# Patient Record
Sex: Female | Born: 1935 | Race: White | Hispanic: No | Smoking: Never smoker
Health system: Southern US, Community
[De-identification: ages and names within clinical notes are randomized; demographics above are authoritative.]

## PROBLEM LIST (undated history)

## (undated) DIAGNOSIS — E860 Dehydration: Secondary | ICD-10-CM

## (undated) DIAGNOSIS — I4891 Unspecified atrial fibrillation: Secondary | ICD-10-CM

## (undated) DIAGNOSIS — I639 Cerebral infarction, unspecified: Secondary | ICD-10-CM

## (undated) DIAGNOSIS — L0390 Cellulitis, unspecified: Secondary | ICD-10-CM

## (undated) HISTORY — DX: Cerebral infarction, unspecified: I63.9

## (undated) HISTORY — DX: Unspecified atrial fibrillation: I48.91

---
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
## 2011-07-26 ENCOUNTER — Encounter: Payer: Self-pay | Admitting: *Deleted

## 2011-07-26 ENCOUNTER — Emergency Department (HOSPITAL_BASED_OUTPATIENT_CLINIC_OR_DEPARTMENT_OTHER)
Admission: EM | Admit: 2011-07-26 | Discharge: 2011-07-26 | Disposition: A | Discharge status: Home / Self Care | Payer: Medicare Other | Attending: Emergency Medicine | Admitting: Emergency Medicine

## 2011-07-26 DIAGNOSIS — M79609 Pain in unspecified limb: Secondary | ICD-10-CM | POA: Insufficient documentation

## 2011-07-26 DIAGNOSIS — M7989 Other specified soft tissue disorders: Secondary | ICD-10-CM | POA: Insufficient documentation

## 2011-07-26 DIAGNOSIS — M79662 Pain in left lower leg: Secondary | ICD-10-CM

## 2011-07-26 LAB — COMPREHENSIVE METABOLIC PANEL
ALT: 8 U/L (ref 0–35)
AST: 18 U/L (ref 0–37)
CO2: 26 mEq/L (ref 19–32)
Calcium: 9.1 mg/dL (ref 8.4–10.5)
Chloride: 101 mEq/L (ref 96–112)
GFR calc Af Amer: >90 mL/min (ref >90)
GFR calc non Af Amer: 83 mL/min — ABNORMAL LOW (ref >90)
Glucose, Bld: 103 mg/dL — ABNORMAL HIGH (ref 70–99)
Sodium: 138 mEq/L (ref 135–145)
Total Bilirubin: 0.3 mg/dL (ref 0.3–1.2)

## 2011-07-26 LAB — DIFFERENTIAL
Basophils Absolute: 0 10*3/uL (ref 0.0–0.1)
Eosinophils Relative: 0 % (ref 0–5)
Lymphocytes Relative: 25 % (ref 12–46)
Lymphs Abs: 1.7 10*3/uL (ref 0.7–4.0)
Monocytes Absolute: 0.7 10*3/uL (ref 0.1–1.0)
Neutro Abs: 4.5 10*3/uL (ref 1.7–7.7)

## 2011-07-26 LAB — URINE MICROSCOPIC-ADD ON

## 2011-07-26 LAB — URINALYSIS, ROUTINE W REFLEX MICROSCOPIC
Bilirubin Urine: NEGATIVE
Glucose, UA: NEGATIVE mg/dL
Hgb urine dipstick: NEGATIVE
Ketones, ur: NEGATIVE mg/dL
Protein, ur: NEGATIVE mg/dL
Urobilinogen, UA: 0.2 mg/dL (ref 0.0–1.0)

## 2011-07-26 LAB — CBC
HCT: 35.7 % — ABNORMAL LOW (ref 36.0–46.0)
MCV: 94.2 fL (ref 78.0–100.0)
RBC: 3.79 MIL/uL — ABNORMAL LOW (ref 3.87–5.11)
RDW: 12 % (ref 11.5–15.5)
WBC: 6.9 10*3/uL (ref 4.0–10.5)

## 2011-07-26 MED ORDER — ENOXAPARIN SODIUM 100 MG/ML ~~LOC~~ SOLN
1.0000 mg/kg | Freq: Once | SUBCUTANEOUS | Status: AC
  Administered 2011-07-26: 50 mg via SUBCUTANEOUS
  Filled 2011-07-26: qty 1

## 2011-07-26 MED ORDER — CEFAZOLIN SODIUM 1-5 GM-% IV SOLN
1.0000 g | Freq: Once | INTRAVENOUS | Status: AC
  Administered 2011-07-26: 1 g via INTRAVENOUS
  Filled 2011-07-26: qty 50

## 2011-07-26 NOTE — ED Notes (Signed)
Pt states she has had swelling to her left lower leg since Thanksgiving. Some redness. Warm to touch.

## 2011-07-26 NOTE — ED Provider Notes (Signed)
History  Scribed for Dione Booze, MD, the patient was seen in room MH03/MH03. This chart was scribed by Candelaria Stagers. The patient's care started at 15:28.      CSN: [id number] Arrival date & time: 07/26/2011  2:14 PM   First MD Initiated Contact with Patient 07/26/11 1506      Chief Complaint  Patient presents with  . Leg Pain     The history is provided by the patient.   Sheila Byrd is a 77 y.o. female who presents to the Emergency Department complaining of swelling, induration, and redness of the left lower extremity that started about four days ago.  The patient states that the area feels sore when weight bearing or ambulating.  Patient has tried elevating the left leg to relieve swelling with transient relief.  She is experiencing no chest pain, SOB, fever, chills, or sweats.  She has no PCP and does not drink or smoke.  Symptoms are described as moderate. He pain and swelling worse when she is standing and walking, better when she is laying flat with her leg elevated. She denies history of trauma.   History reviewed. No pertinent past medical history.  Past Surgical History  Procedure Date  . Tonsillectomy     History reviewed. No pertinent family history.  History  Substance Use Topics  . Smoking status: Never Smoker   . Smokeless tobacco: Not on file  . Alcohol Use: No     Review of Systems  Constitutional: Negative for fever, chills and diaphoresis.  HENT: Negative for rhinorrhea.   Respiratory: Negative for cough and shortness of breath.   Cardiovascular: Negative for chest pain.  10 Systems reviewed and are negative for acute change except as noted in the HPI.   Allergies  Review of patient's allergies indicates no known allergies.  Home Medications  No current outpatient prescriptions on file.  BP 159/71  Pulse 84  Temp(Src) 98.6 F (37 C) (Oral)  Resp 19  Ht 5\' 2"  (1.575 m)  Wt 110 lb (49.896 kg)  BMI 20.12 kg/m2  SpO2 99%  Physical Exam    Nursing note and vitals reviewed. Constitutional: She is oriented to person, place, and time. She appears well-developed and well-nourished. No distress.  HENT:  Head: Normocephalic and atraumatic.  Mouth/Throat: Oropharynx is clear and moist. No oropharyngeal exudate.  Eyes: EOM are normal. Scleral icterus (mild sclera icterus) is present.  Neck: Normal range of motion. Neck supple.  Cardiovascular: Normal rate, regular rhythm and intact distal pulses.   Pulmonary/Chest: Effort normal and breath sounds normal.  Musculoskeletal: She exhibits edema (left lower extremity, warm to touch).       5cmx5cm at area of induration. surrounding area of erythremia present that encompasses area of induration.   Heberden's and bouchard's nodes noted on interphalangeal joints bilaterally.   Lymphadenopathy:    She has no cervical adenopathy.  Neurological: She is alert and oriented to person, place, and time.  Skin: Skin is warm and dry.  Psychiatric: She has a normal mood and affect. Her behavior is normal.  Neck: No JVD. Abdomen: Soft, flat, nontender without masses or hepatosplenomegaly. Musculoskeletal: There are no nodes palpable negative Homans sign. No lymphangitic streaks.  ED Course  Procedures   DIAGNOSTIC STUDIES: Oxygen Saturation is 99% on room air, normal by my interpretation.    COORDINATION OF CARE:  15:33 Ordered: CBC ; Differential ; Comprehensive metabolic panel ; Sedimentation rate ; Urinalysis with microscopic ; US Venous Img Lower Unilateral Left  15:38 Ordered: D-DIMER, QUANTITATIVE   Labs Reviewed - No data to display No results found. Results for orders placed during the hospital encounter of 07/26/11  CBC      Component Value Range   WBC 6.9  4.0 - 10.5 (K/uL)   RBC 3.79 (*) 3.87 - 5.11 (MIL/uL)   Hemoglobin 11.9 (*) 12.0 - 15.0 (g/dL)   HCT 40.9 (*) 81.1 - 46.0 (%)   MCV 94.2  78.0 - 100.0 (fL)   MCH 31.4  26.0 - 34.0 (pg)   MCHC 33.3  30.0 - 36.0 (g/dL)    RDW 91.4  78.2 - 95.6 (%)   Platelets 262  150 - 400 (K/uL)  DIFFERENTIAL      Component Value Range   Neutrophils Relative 64  43 - 77 (%)   Neutro Abs 4.5  1.7 - 7.7 (K/uL)   Lymphocytes Relative 25  12 - 46 (%)   Lymphs Abs 1.7  0.7 - 4.0 (K/uL)   Monocytes Relative 10  3 - 12 (%)   Monocytes Absolute 0.7  0.1 - 1.0 (K/uL)   Eosinophils Relative 0  0 - 5 (%)   Eosinophils Absolute 0.0  0.0 - 0.7 (K/uL)   Basophils Relative 0  0 - 1 (%)   Basophils Absolute 0.0  0.0 - 0.1 (K/uL)  COMPREHENSIVE METABOLIC PANEL      Component Value Range   Sodium 138  135 - 145 (mEq/L)   Potassium 3.8  3.5 - 5.1 (mEq/L)   Chloride 101  96 - 112 (mEq/L)   CO2 26  19 - 32 (mEq/L)   Glucose, Bld 103 (*) 70 - 99 (mg/dL)   BUN 12  6 - 23 (mg/dL)   Creatinine, Ser 2.13  0.50 - 1.10 (mg/dL)   Calcium 9.1  8.4 - 08.6 (mg/dL)   Total Protein 7.6  6.0 - 8.3 (g/dL)   Albumin 3.8  3.5 - 5.2 (g/dL)   AST 18  0 - 37 (U/L)   ALT 8  0 - 35 (U/L)   Alkaline Phosphatase 69  39 - 117 (U/L)   Total Bilirubin 0.3  0.3 - 1.2 (mg/dL)   GFR calc non Af Amer 83 (*) >90 (mL/min)   GFR calc Af Amer >90  >90 (mL/min)  D-DIMER, QUANTITATIVE      Component Value Range   D-Dimer, Quant 1.24 (*) 0.00 - 0.48 (ug/mL-FEU)   No results found.    No diagnosis found.  Vascular ultrasound is not available today. Elevated d-dimer and normal WBC suggest that it is more likely that she has DVT than cellulitis. She will be given a dose of Ancef and a dose of Lovenox here today, and will be brought back tomorrow for a venous Doppler.  MDM  Lower leg swelling and pain in induration-DVT versus cellulitis with cellulitis being much more likely    I personally performed the services described in this documentation, which was scribed in my presence. The recorded information has been reviewed and considered.  Dione Booze, MD 07/26/11 [phone]

## 2011-07-27 ENCOUNTER — Ambulatory Visit (HOSPITAL_BASED_OUTPATIENT_CLINIC_OR_DEPARTMENT_OTHER)
Admission: RE | Admit: 2011-07-27 | Discharge: 2011-07-27 | Disposition: A | Discharge status: Home / Self Care | Payer: Medicare Other | Source: Ambulatory Visit | Attending: Emergency Medicine | Admitting: Emergency Medicine

## 2011-07-27 DIAGNOSIS — M7989 Other specified soft tissue disorders: Secondary | ICD-10-CM | POA: Insufficient documentation

## 2012-03-16 ENCOUNTER — Emergency Department (HOSPITAL_BASED_OUTPATIENT_CLINIC_OR_DEPARTMENT_OTHER)
Admission: EM | Admit: 2012-03-16 | Discharge: 2012-03-16 | Disposition: A | Discharge status: Home Health | Payer: Medicare Other | Attending: Emergency Medicine | Admitting: Emergency Medicine

## 2012-03-16 ENCOUNTER — Encounter (HOSPITAL_BASED_OUTPATIENT_CLINIC_OR_DEPARTMENT_OTHER): Payer: Self-pay | Admitting: *Deleted

## 2012-03-16 DIAGNOSIS — L2089 Other atopic dermatitis: Secondary | ICD-10-CM | POA: Insufficient documentation

## 2012-03-16 DIAGNOSIS — L03116 Cellulitis of left lower limb: Secondary | ICD-10-CM

## 2012-03-16 DIAGNOSIS — L209 Atopic dermatitis, unspecified: Secondary | ICD-10-CM

## 2012-03-16 DIAGNOSIS — L03119 Cellulitis of unspecified part of limb: Secondary | ICD-10-CM | POA: Insufficient documentation

## 2012-03-16 DIAGNOSIS — L02419 Cutaneous abscess of limb, unspecified: Secondary | ICD-10-CM | POA: Insufficient documentation

## 2012-03-16 HISTORY — DX: Cellulitis, unspecified: L03.90

## 2012-03-16 MED ORDER — TRIAMCINOLONE ACETONIDE 0.1 % EX LOTN: TOPICAL_LOTION | Freq: Two times a day (BID) | CUTANEOUS | Status: DC

## 2012-03-16 MED ORDER — SULFAMETHOXAZOLE-TRIMETHOPRIM 800-160 MG PO TABS: 1.0000 | ORAL_TABLET | Freq: Two times a day (BID) | ORAL | Status: DC

## 2012-03-16 MED ORDER — CEPHALEXIN 500 MG PO CAPS: 500.0000 mg | ORAL_CAPSULE | Freq: Four times a day (QID) | ORAL | Status: AC

## 2012-03-16 MED ORDER — CEPHALEXIN 500 MG PO CAPS: 500.0000 mg | ORAL_CAPSULE | Freq: Four times a day (QID) | ORAL | Status: DC

## 2012-03-16 NOTE — ED Notes (Signed)
Left lower leg is swollen and red. Hx of cellulitis in December of same leg.

## 2012-03-16 NOTE — ED Provider Notes (Signed)
History     CSN: [id number]  Arrival date & time 03/16/12  1430   First MD Initiated Contact with Patient 03/16/12 1513      Chief Complaint  Patient presents with  . Leg Swelling    (Consider location/radiation/quality/duration/timing/severity/associated sxs/prior treatment) HPI  78 year old F presenting with left lower extremity redness and swelling. The redness is located on the medial lower leg proximal to the ankle. The swelling is circumferential around the ankle and lower leg. These symptoms are similar to those she had in December when she was diagnosed with cellulitis. At that time she was treated only with IV Ancef x 1, and the symptoms resolved. Her second skin issue is bilateral upper extremity itching. It is severe at night. It started spontaneously 3 days ago. She has been scratching to the point of breaking the skin. For relief she has tried soaking her arms in epsom salt and rubbing Listerine on her arms. The patient denies any contact with poison plants, insect bites, and denies a history of eczema. Additionally she denies constitutional symptoms such as fever or malaise, joint aches, myalgias, chest pain and shortness of breath.  Past Medical History  Diagnosis Date  . Cellulitis     Past Surgical History  Procedure Date  . Tonsillectomy     No family history on file.  History  Substance Use Topics  . Smoking status: Never Smoker   . Smokeless tobacco: Not on file  . Alcohol Use: No    OB History    Grav Para Term Preterm Abortions TAB SAB Ect Mult Living                  Review of Systems  All other systems reviewed and are negative.    Allergies  Review of patient's allergies indicates no known allergies.  Home Medications   Current Outpatient Rx  Name Route Sig Dispense Refill  . CEPHALEXIN 500 MG PO CAPS Oral Take 1 capsule (500 mg total) by mouth 4 (four) times daily. 28 capsule 0  . TRIAMCINOLONE ACETONIDE 0.1 % EX LOTN Topical Apply  topically 2 (two) times daily. Use for 5-7 days. 60 mL 0    BP 185/75  Pulse 100  Temp 98.4 F (36.9 C) (Oral)  Resp 20  SpO2 97%  Physical Exam  Constitutional: She appears well-developed and well-nourished. No distress.  HENT:  Head: Normocephalic and atraumatic.  Eyes: Conjunctivae are normal. Pupils are equal, round, and reactive to light.  Neck: Normal range of motion.  Cardiovascular: Normal rate and regular rhythm.        Multiple PVCs h RI heard on auscultation  Pulmonary/Chest: Effort normal and breath sounds normal.  Abdominal: Soft. Normal appearance.  Skin: Skin is warm. Abrasion and rash noted. No petechiae noted. Rash is macular and urticarial. Rash is not papular and not nodular. She is not diaphoretic.       ED Course  Procedures (including critical care time)  Labs Reviewed - No data to display No results found.   1. Cellulitis of left lower extremity   2. Atopic dermatitis       MDM  78 year old female with cellulitis of the left lower extremity treated with Keflex and atopic dermatitis of the bilateral upper extremities treated with triamcinolone cream. She was stable and appropriate for discharge. The patient was in agreement with this plan.         Garnetta Buddy, MD 03/16/12 2337

## 2012-03-17 NOTE — ED Provider Notes (Signed)
I saw and evaluated the patient, reviewed the resident's note and I agree with the findings and plan.  Issabela Lesko, MD 03/17/12 1216 

## 2012-03-26 ENCOUNTER — Encounter (HOSPITAL_BASED_OUTPATIENT_CLINIC_OR_DEPARTMENT_OTHER): Payer: Self-pay

## 2012-03-26 ENCOUNTER — Emergency Department (HOSPITAL_BASED_OUTPATIENT_CLINIC_OR_DEPARTMENT_OTHER)
Admission: EM | Admit: 2012-03-26 | Discharge: 2012-03-26 | Disposition: A | Discharge status: Home / Self Care | Payer: Medicare Other | Attending: Emergency Medicine | Admitting: Emergency Medicine

## 2012-03-26 DIAGNOSIS — L03119 Cellulitis of unspecified part of limb: Secondary | ICD-10-CM | POA: Insufficient documentation

## 2012-03-26 DIAGNOSIS — L03116 Cellulitis of left lower limb: Secondary | ICD-10-CM

## 2012-03-26 DIAGNOSIS — D649 Anemia, unspecified: Secondary | ICD-10-CM

## 2012-03-26 DIAGNOSIS — L02419 Cutaneous abscess of limb, unspecified: Secondary | ICD-10-CM | POA: Insufficient documentation

## 2012-03-26 LAB — BASIC METABOLIC PANEL
BUN: 11 mg/dL (ref 6–23)
CO2: 27 mEq/L (ref 19–32)
Chloride: 101 mEq/L (ref 96–112)
GFR calc Af Amer: >90 mL/min (ref >90)
Potassium: 3.8 mEq/L (ref 3.5–5.1)

## 2012-03-26 LAB — BASIC METABOLIC PANEL WITH GFR
Calcium: 8.9 mg/dL (ref 8.4–10.5)
Creatinine, Ser: 0.6 mg/dL (ref 0.50–1.10)
GFR calc non Af Amer: 83 mL/min — ABNORMAL LOW (ref >90)
Glucose, Bld: 95 mg/dL (ref 70–99)
Sodium: 139 meq/L (ref 135–145)

## 2012-03-26 LAB — CBC WITH DIFFERENTIAL/PLATELET
Basophils Absolute: 0 K/uL (ref 0.0–0.1)
Basophils Relative: 1 % (ref 0–1)
Eosinophils Absolute: 0.1 K/uL (ref 0.0–0.7)
Eosinophils Relative: 1 % (ref 0–5)
HCT: 32.9 % — ABNORMAL LOW (ref 36.0–46.0)
Hemoglobin: 10.9 g/dL — ABNORMAL LOW (ref 12.0–15.0)
Lymphocytes Relative: 25 % (ref 12–46)
Lymphs Abs: 1.6 10*3/uL (ref 0.7–4.0)
MCH: 31.4 pg (ref 26.0–34.0)
MCHC: 33.1 g/dL (ref 30.0–36.0)
MCV: 94.8 fL (ref 78.0–100.0)
Monocytes Absolute: 0.7 K/uL (ref 0.1–1.0)
Monocytes Relative: 10 % (ref 3–12)
Neutro Abs: 4.2 10*3/uL (ref 1.7–7.7)
Neutrophils Relative %: 63 % (ref 43–77)
Platelets: 231 K/uL (ref 150–400)
RBC: 3.47 MIL/uL — ABNORMAL LOW (ref 3.87–5.11)
RDW: 12 % (ref 11.5–15.5)
WBC: 6.5 10*3/uL (ref 4.0–10.5)

## 2012-03-26 MED ORDER — CLINDAMYCIN PHOSPHATE 600 MG/50ML IV SOLN
600.0000 mg | Freq: Once | INTRAVENOUS | Status: AC
  Administered 2012-03-26: 600 mg via INTRAVENOUS
  Filled 2012-03-26: qty 50

## 2012-03-26 MED ORDER — CLINDAMYCIN HCL 150 MG PO CAPS: 300.0000 mg | ORAL_CAPSULE | Freq: Three times a day (TID) | ORAL | Status: AC

## 2012-03-26 NOTE — ED Notes (Signed)
Recent diagnosis of cellulitis of left leg/foot.  Redness and swelling noted. Seen in ED for same and started on PO ABX.

## 2012-03-26 NOTE — ED Provider Notes (Signed)
History     CSN: [id number]  Arrival date & time 03/26/12  1710   First MD Initiated Contact with Patient 03/26/12 1803      Chief Complaint  Patient presents with  . Leg Swelling    (Consider location/radiation/quality/duration/timing/severity/associated sxs/prior treatment) HPI Comments: Patient was seen approximately one week ago do to similar complaints of lower left leg swelling with redness and some mild pain. In the past she was seen in December for similar and was given a dose of IV antibiotics which seemed to improve the symptoms. At that time that also done an ultrasound the following morning to make sure no clot was there which it was not. However she did not check back in and therefore never received a oral prescription of antibiotics and however the symptoms did resolve. This time she was simply given a oral prescription of Keflex to take for one week and the symptoms have not improved. The patient reports his symptoms are still very similar to what she had in the past. The patient and family request that IV be given. The patient denies any systemic fever or chills. She denies any chest pain, pleurisy or cough. No shortness of breath. They deny any focal area of drainage. He did not recall any specific injury, insect bite to the left lower leg. The patient denies any numbness or weakness.  The history is provided by the patient, a relative and medical records.    Past Medical History  Diagnosis Date  . Cellulitis     Past Surgical History  Procedure Date  . Tonsillectomy     History reviewed. No pertinent family history.  History  Substance Use Topics  . Smoking status: Never Smoker   . Smokeless tobacco: Not on file  . Alcohol Use: No    OB History    Grav Para Term Preterm Abortions TAB SAB Ect Mult Living                  Review of Systems  Constitutional: Negative for fever and chills.  Musculoskeletal: Positive for joint swelling and arthralgias.    Skin: Positive for color change. Negative for rash and wound.  Neurological: Negative for weakness and numbness.  All other systems reviewed and are negative.    Allergies  Review of patient's allergies indicates no known allergies.  Home Medications   Current Outpatient Rx  Name Route Sig Dispense Refill  . CEPHALEXIN 500 MG PO CAPS Oral Take 1 capsule (500 mg total) by mouth 4 (four) times daily. 28 capsule 0  . CLINDAMYCIN HCL 150 MG PO CAPS Oral Take 2 capsules (300 mg total) by mouth 3 (three) times daily. 30 capsule 0    BP 170/77  Pulse 92  Temp 98.8 F (37.1 C) (Oral)  Resp 16  Ht 5\' 2"  (1.575 m)  Wt 110 lb (49.896 kg)  BMI 20.12 kg/m2  SpO2 98%  Physical Exam  Vitals reviewed. Constitutional: She appears well-developed and well-nourished.  HENT:  Head: Normocephalic and atraumatic.  Eyes: Conjunctivae are normal. Pupils are equal, round, and reactive to light.  Neck: Normal range of motion. Neck supple.  Cardiovascular: Normal rate and regular rhythm.   Pulmonary/Chest: Effort normal.  Abdominal: Soft.  Musculoskeletal:       Left lower leg: She exhibits tenderness, swelling and edema. She exhibits no deformity and no laceration.       Legs: Neurological: She is alert. She has normal strength. No sensory deficit. She exhibits normal muscle  tone.  Skin: Skin is warm and dry.  Psychiatric: She has a normal mood and affect.    ED Course  Procedures (including critical care time)  Labs Reviewed  CBC WITH DIFFERENTIAL - Abnormal; Notable for the following:    RBC 3.47 (*)     Hemoglobin 10.9 (*)     HCT 32.9 (*)     All other components within normal limits  BASIC METABOLIC PANEL - Abnormal; Notable for the following:    GFR calc non Af Amer 83 (*)     All other components within normal limits   No results found.   1. Cellulitis of left leg   2. Anemia     8:07 PM No allergic reaction, clindamycin as tolerated. Her platelet count is normal. Her  white cell count is not elevated. She is mildly anemic and this is described to the patient and family. My plan is to put her on clindamycin orally and that I have encouraged her to followup with her primary care physician for recheck next week.  MDM  I reviewed prior ED notes. My plan is to give her a dose of IV clindamycin and to start on oral clindamycin. It is fairly possible that this time her cellulitis is caused by something like MRSA and that Keflex was ineffective. There is no apparent abscess several require surgical drainage. She is not septic appearing and has no fever. I will get baseline blood tests and I suspect the patient will still be able to be discharged with oral clindamycin following her IV dose.        Gavin Pound. Oletta Lamas, MD 03/26/12 2011

## 2012-03-26 NOTE — Discharge Instructions (Signed)
 Anemia, Nonspecific Your exam and blood tests show you are anemic. This means your blood (hemoglobin) level is low. Normal hemoglobin values are 12 to 15 g/dL for females and 14 to 17 g/dL for males. Make a note of your hemoglobin level today. The hematocrit percent is also used to measure anemia. A normal hematocrit is 38% to 46% in females and 42% to 49% in males. Make a note of your hematocrit level today. CAUSES  Anemia can be due to many different causes.  Excessive bleeding from periods (in women).   Intestinal bleeding.   Poor nutrition.   Kidney, thyroid, liver, and bone marrow diseases.  SYMPTOMS  Anemia can come on suddenly (acute). It can also come on slowly. Symptoms can include:  Minor weakness.   Dizziness.   Palpitations.   Shortness of breath.  Symptoms may be absent until half your hemoglobin is missing if it comes on slowly. Anemia due to acute blood loss from an injury or internal bleeding may require blood transfusion if the loss is severe. Hospital care is needed if you are anemic and there is significant continual blood loss. TREATMENT   Stool tests for blood (Hemoccult) and additional lab tests are often needed. This determines the best treatment.   Further checking on your condition and your response to treatment is very important. It often takes many weeks to correct anemia.  Depending on the cause, treatment can include:  Supplements of iron.   Vitamins B12 and folic acid.   Hormone medicines.If your anemia is due to bleeding, finding the cause of the blood loss is very important. This will help avoid further problems.  SEEK IMMEDIATE MEDICAL CARE IF:   You develop fainting, extreme weakness, shortness of breath, or chest pain.   You develop heavy vaginal bleeding.   You develop bloody or black, tarry stools or vomit up blood.   You develop a high fever, rash, repeated vomiting, or dehydration.  Document Released: 09/18/2004 Document Revised:  07/31/2011 Document Reviewed: 06/26/2009 [facility] Patient Information 2012 [place], [place].   Cellulitis Cellulitis is an infection of the skin and the tissue beneath it. The area is typically red and tender. It is caused by germs (bacteria) (usually staph or strep) that enter the body through cuts or sores. Cellulitis most commonly occurs in the arms or lower legs.  HOME CARE INSTRUCTIONS   If you are given a prescription for medications which kill germs (antibiotics), take as directed until finished.   If the infection is on the arm or leg, keep the limb elevated as able.   Use a warm cloth several times per day to relieve pain and encourage healing.   See your caregiver for recheck of the infected site as directed if problems arise.   Only take over-the-counter or prescription medicines for pain, discomfort, or fever as directed by your caregiver.  SEEK MEDICAL CARE IF:   The area of redness (inflammation) is spreading, there are red streaks coming from the infected site, or if a part of the infection begins to turn dark in color.   The joint or bone underneath the infected skin becomes painful after the skin has healed.   The infection returns in the same or another area after it seems to have gone away.   A boil or bump swells up. This may be an abscess.   New, unexplained problems such as pain or fever develop.  SEEK IMMEDIATE MEDICAL CARE IF:   You have a fever.   You  or your child feels drowsy or lethargic.   There is vomiting, diarrhea, or lasting discomfort or feeling ill (malaise) with muscle aches and pains.  MAKE SURE YOU:   Understand these instructions.   Will watch your condition.   Will get help right away if you are not doing well or get worse.  Document Released: 05/21/2005 Document Revised: 07/31/2011 Document Reviewed: 03/29/2008 [facility] Patient Information 2012 [place], [place].     RESOURCE GUIDE  Chronic Pain Problems: Contact [facility]  [phone] Patients need to be referred by their primary care doctor.  Insufficient Money for Medicine: Contact United Way:  call 211 or [employer] [phone].  No Primary Care Doctor: - Call [facility]  [phone] - can help you locate a primary care doctor that  accepts your insurance, provides certain services, etc. - Physician Referral Service- [phone]  Agencies that provide inexpensive medical care: - [facility]  [phone] - [facility]  [phone] - [facility]  [phone] - Women's Clinic  [phone] - [facility]  [phone] [facility]  [phone]  Medicaid-accepting [facility] Providers: - [facility]- [address], [address]  [phone], Mon-Fri 9am-7pm, Sat 9am-1pm - [facility]- [address] [place], [address] [place]  [phone] - [facility]- [address], [address] [place]  [phone] [facility]- [address]  [phone] - Kennieth Leech- [address] [place], [address], [phone]  Only accepts [place] patients after they have their name  applied to their card  Self Pay (no insurance) in [place]: - Sickle Cell Patients: Dr Camellia Hutchinson, [facility] Internal [facility]  [address] [place], [phone] - [facility]- [address] [place]  [phone]       [place] [facility]- [address]       -     [facility]- see information above (Speak to [employer] if you do not have insurance)       -  [facility]- [address] [place], [phone]       -  [facility]- [address],  [phone]       -  [facility]- [address] [phone]       -  Dr [place]ina-  [address], [place], [phone]       -  [facility]- [address] [phone]       -  [facility]- [address], [phone], also [address] [phone]       -    [facility]- [address] [place], [phone], 1st & 3rd Saturday   every month, 10am-1pm  1) Find a Doctor and Pay Out of Pocket Although you won't have to find out who is covered by your insurance plan, it is a good idea to ask around and get recommendations. You will then need to call the office and see if the doctor you have chosen will accept you as a new patient and what types of options they offer for patients who are self-pay. Some doctors offer discounts or will set up payment plans for their patients who do not have insurance, but you will need to ask so you aren't surprised  when you get to your appointment.  2) Contact Your Local Health Department Not all health departments have doctors that can see patients for sick visits, but many do, so it is worth a call to see if yours does. If you don't know where your local health department is, you can check in your phone book. The CDC also has a tool to help you locate your state's health department, and many state websites also have listings of all of their local health departments.  3) Find a Walk-in Clinic If your illness is not likely to be very severe or complicated, you may want to try a walk in clinic. These are popping up all over the country in pharmacies, drugstores, and shopping centers. They're usually staffed by nurse practitioners or physician assistants that have been trained to treat common illnesses and complaints. They're usually fairly quick and inexpensive. However, if you have serious medical issues or chronic medical problems, these are probably not your best option  STD Testing - [facility] Department of [facility], STD Clinic, [address] [place], phone [phone] or [phone].  Monday - Friday, call for an appointment. [facility] Department of [employer], STD Clinic, [address], [place], phone [phone] or [phone].  Monday - Friday, call for an appointment.  Abuse/Neglect: [facility] Child Abuse Hotline 956-361-6395 [facility] Child Abuse Hotline (325)529-3443 (After Hours)  Emergency Shelter:  [facility] (862)790-5274  Maternity Homes: - [facility] (562)415-5132 - [facility] (251)047-8142  MRSA Hotline #:   778-767-0895  [facility] Resources  [facility] [place]  United Way [facility] Dept. [address]                 [address]         [address] [place] [address]  Tinnie Keenan Keenan Phone:  [phone]                                  Phone:  [phone]                   Phone:  [phone]  [facility] Mental Health, [phone] - [phone]       -     [facility] in [place], [address],                                  [phone], [facility] Child Abuse Hotline 418-153-0731 or (917) 460-8262 (After Hours)   Behavioral Health Services  Substance Abuse Resources: - [facility] Drug [facility]  [phone] - [facility] (308)108-3208 - [facility] [place] [phone] [place] [phone] - Residential & Outpatient Substance Abuse Program  [phone]  Psychological Services: -  [facility]  [phone] - [address] Services  [phone] - [facility], [phone] [place]. [address] [place], ACCESS LINE: 9130288093 or (820)[phone], [employer]  Dental Assistance  If unable to pay or uninsured, contact:  [facility] or [facility]. to become qualified for the adult dental clinic.  Patients with Medicaid: [facility] [phone] [address] [place], [phone] [address], [phone]  If unable to pay, or uninsured, contact [facility] (323)043-9277) or [facility] Department [phone] in [place], [phone] in [facility]) to become qualified for the adult dental clinic  Other Low-Cost Community Dental Services: - [facility]- [address] [place], [place], [place], [address], [phone], Ext. [phone], 2nd and 4th Thursday of the month at 6:30am.  10 clients each day by appointment, can sometimes see walk-in patients if someone does not show for an appointment. [facility]- [address] [place], [place], [address], [phone] - [facility]- [address], [place], [place], [address], [phone] - [facility]- [phone] [facility] Health Department- [phone] [place] Department- [phone]

## 2012-11-12 ENCOUNTER — Emergency Department (HOSPITAL_COMMUNITY): Payer: Medicare Other

## 2012-11-12 ENCOUNTER — Emergency Department (HOSPITAL_COMMUNITY)
Admission: EM | Admit: 2012-11-12 | Discharge: 2012-11-13 | Disposition: A | Discharge status: Home / Self Care | Payer: Medicare Other | Attending: Emergency Medicine | Admitting: Emergency Medicine

## 2012-11-12 DIAGNOSIS — E860 Dehydration: Secondary | ICD-10-CM | POA: Insufficient documentation

## 2012-11-12 DIAGNOSIS — R55 Syncope and collapse: Secondary | ICD-10-CM | POA: Insufficient documentation

## 2012-11-12 DIAGNOSIS — R110 Nausea: Secondary | ICD-10-CM | POA: Insufficient documentation

## 2012-11-12 DIAGNOSIS — Z872 Personal history of diseases of the skin and subcutaneous tissue: Secondary | ICD-10-CM | POA: Insufficient documentation

## 2012-11-12 MED ORDER — SODIUM CHLORIDE 0.9 % IV BOLUS (SEPSIS)
500.0000 mL | Freq: Once | INTRAVENOUS | Status: AC
  Administered 2012-11-12: 500 mL via INTRAVENOUS

## 2012-11-12 NOTE — ED Notes (Signed)
Origional EKG given to Dr. Adriana Simas

## 2012-11-12 NOTE — ED Notes (Signed)
Pt notified that urine sample is needed 

## 2012-11-12 NOTE — ED Notes (Signed)
Pt present via EMS with c/o dizziness.  Pt just lost her husband and reports per EMS "daughter says today was more bust than usual."  Pt has no medical hx.  Pt not on any meds.  Pt vitals 160/80 74.  Pt cbg 132.  Pt ekg per ems nsr with pvc's.  Pt aaox3.

## 2012-11-12 NOTE — ED Notes (Signed)
[id number]<AV> Expected date:<BR> Expected time:<BR> Means of arrival:<BR> Comments:<BR> Elderly, EMS

## 2012-11-12 NOTE — ED Provider Notes (Signed)
History     CSN: [id number]  Arrival date & time 11/12/12  2220   First MD Initiated Contact with Patient 11/12/12 2258      Chief Complaint  Patient presents with  . Dizziness    (Consider location/radiation/quality/duration/timing/severity/associated sxs/prior treatment) HPI Pt brought to the ED via EMS from home where she reports she was in her normal state of health until just before bed when she began to feel dizzy while sitting at the kitchen table. She did not lose consciousness, but slumped over in the chair per daughter who was with her at the time. She denies any prodromal CP, SOB or palpitations. She states symptoms are worse with standing and associated with nausea. She has dry mouth now, but denies any symptoms while lying in bed. No recent illnesses although her husband recently died and she and daughter were busier than usual today with the arrangements.  Past Medical History  Diagnosis Date  . Cellulitis     Past Surgical History  Procedure Laterality Date  . Tonsillectomy      No family history on file.  History  Substance Use Topics  . Smoking status: Never Smoker   . Smokeless tobacco: Not on file  . Alcohol Use: No    OB History   Grav Para Term Preterm Abortions TAB SAB Ect Mult Living                  Review of Systems All other systems reviewed and are negative except as noted in HPI.   Allergies  Review of patient's allergies indicates no known allergies.  Home Medications  No current outpatient prescriptions on file.  BP 170/62  Pulse 76  Temp(Src) 97.8 F (36.6 C) (Oral)  Resp 22  SpO2 96%  Physical Exam  Nursing note and vitals reviewed. Constitutional: She is oriented to person, place, and time. She appears well-developed and well-nourished.  HENT:  Head: Normocephalic and atraumatic.  Dry mouth  Eyes: EOM are normal. Pupils are equal, round, and reactive to light.  Neck: Normal range of motion. Neck supple.   Cardiovascular: Normal rate, normal heart sounds and intact distal pulses.   Pulmonary/Chest: Effort normal and breath sounds normal.  Abdominal: Bowel sounds are normal. She exhibits no distension. There is no tenderness.  Musculoskeletal: Normal range of motion. She exhibits no edema and no tenderness.  Neurological: She is alert and oriented to person, place, and time. She has normal strength. She displays normal reflexes. No cranial nerve deficit or sensory deficit. Coordination normal.  Feels dizzy sitting up but no nystagmus  Skin: Skin is warm and dry. No rash noted.  Psychiatric: She has a normal mood and affect.    ED Course  Procedures (including critical care time)  Labs Reviewed  CBC WITH DIFFERENTIAL - Abnormal; Notable for the following:    RBC 3.67 (*)    Hemoglobin 11.4 (*)    HCT 34.1 (*)    Neutrophils Relative 88 (*)    Neutro Abs 9.3 (*)    Lymphocytes Relative 6 (*)    Lymphs Abs 0.6 (*)    All other components within normal limits  BASIC METABOLIC PANEL - Abnormal; Notable for the following:    Sodium 133 (*)    Glucose, Bld 141 (*)    Calcium 8.3 (*)    GFR calc non Af Amer 80 (*)    All other components within normal limits  URINALYSIS, ROUTINE W REFLEX MICROSCOPIC - Abnormal; Notable for the  following:    Leukocytes, UA MODERATE (*)    All other components within normal limits  TROPONIN I  URINE MICROSCOPIC-ADD ON   Dg Chest 2 View  11/13/2012  *RADIOLOGY REPORT*  Clinical Data: Shortness of breath and syncope.  CHEST - 2 VIEW  Comparison: None  Findings: Upper limits normal heart size noted. Mild peribronchial thickening is identified. There is no evidence of focal airspace disease, pulmonary edema, suspicious pulmonary nodule/mass, pleural effusion, or pneumothorax. No acute bony abnormalities are identified.  IMPRESSION: Upper limits normal heart size and mild peribronchial thickening which is likely chronic.   Original Report Authenticated By:  Harmon Pier, M.D.    Ct Head Wo Contrast  11/12/2012  *RADIOLOGY REPORT*  Clinical Data: 79 year old female with dizziness and near syncope.  CT HEAD WITHOUT CONTRAST  Technique:  Contiguous axial images were obtained from the base of the skull through the vertex without contrast.  Comparison: None  Findings: Generalized cerebral volume loss, mild chronic small vessel white matter ischemic changes and a remote left parietal infarct noted. No acute intracranial abnormalities are identified, including mass lesion or mass effect, hydrocephalus, extra-axial fluid collection, midline shift, hemorrhage, or acute infarction.  The visualized bony calvarium is unremarkable.  IMPRESSION: No evidence of acute intracranial abnormality.  Atrophy, chronic small vessel white matter ischemic changes and remote left parietal infarct.   Original Report Authenticated By: Harmon Pier, M.D.      1. Near syncope   2. Dehydration       MDM   Date: 11/12/2012  Rate: 77  Rhythm: normal sinus rhythm  QRS Axis: normal  Intervals: normal  ST/T Wave abnormalities: normal  Conduction Disutrbances:none  Narrative Interpretation:   Old EKG Reviewed: none available   3:39 AM Labs and imaging unremarkable. Pt feeling better. Not orthostatic, but did not make urine until after IVF bolus. Suspect this near syncope was dehydration related. Doubt cardiac or central neurologic process. Will ambulate in the ED and if able to get around without feeling dizzy plan for discharge home. Pt and daughter at bedside amenable to this plan.        Charles B. Bernette Mayers, MD 11/13/12 1610

## 2012-11-13 LAB — CBC WITH DIFFERENTIAL/PLATELET
HCT: 34.1 % — ABNORMAL LOW (ref 36.0–46.0)
Hemoglobin: 11.4 g/dL — ABNORMAL LOW (ref 12.0–15.0)
Lymphocytes Relative: 6 % — ABNORMAL LOW (ref 12–46)
MCHC: 33.4 g/dL (ref 30.0–36.0)
MCV: 92.9 fL (ref 78.0–100.0)
Monocytes Absolute: 0.6 10*3/uL (ref 0.1–1.0)
Monocytes Relative: 6 % (ref 3–12)
Neutro Abs: 9.3 10*3/uL — ABNORMAL HIGH (ref 1.7–7.7)

## 2012-11-13 LAB — URINALYSIS, ROUTINE W REFLEX MICROSCOPIC
Bilirubin Urine: NEGATIVE
Glucose, UA: NEGATIVE mg/dL
Hgb urine dipstick: NEGATIVE
Ketones, ur: NEGATIVE mg/dL
pH: 7 (ref 5.0–8.0)

## 2012-11-13 LAB — BASIC METABOLIC PANEL
BUN: 13 mg/dL (ref 6–23)
CO2: 25 mEq/L (ref 19–32)
Chloride: 97 mEq/L (ref 96–112)
Creatinine, Ser: 0.65 mg/dL (ref 0.50–1.10)

## 2012-11-13 LAB — URINE MICROSCOPIC-ADD ON

## 2012-11-13 NOTE — ED Notes (Addendum)
In and out cath returned unmeasurable amount of urine.  Notified Alinda Money, RN

## 2014-01-29 ENCOUNTER — Emergency Department (HOSPITAL_COMMUNITY)
Admission: EM | Admit: 2014-01-29 | Discharge: 2014-01-29 | Disposition: A | Discharge status: Home / Self Care | Payer: Medicare Other | Attending: Emergency Medicine | Admitting: Emergency Medicine

## 2014-01-29 ENCOUNTER — Encounter (HOSPITAL_COMMUNITY): Payer: Self-pay | Admitting: Emergency Medicine

## 2014-01-29 DIAGNOSIS — W010XXA Fall on same level from slipping, tripping and stumbling without subsequent striking against object, initial encounter: Secondary | ICD-10-CM | POA: Insufficient documentation

## 2014-01-29 DIAGNOSIS — W19XXXA Unspecified fall, initial encounter: Secondary | ICD-10-CM

## 2014-01-29 DIAGNOSIS — Z043 Encounter for examination and observation following other accident: Secondary | ICD-10-CM | POA: Insufficient documentation

## 2014-01-29 DIAGNOSIS — Z862 Personal history of diseases of the blood and blood-forming organs and certain disorders involving the immune mechanism: Secondary | ICD-10-CM | POA: Insufficient documentation

## 2014-01-29 DIAGNOSIS — Y939 Activity, unspecified: Secondary | ICD-10-CM | POA: Insufficient documentation

## 2014-01-29 DIAGNOSIS — Z872 Personal history of diseases of the skin and subcutaneous tissue: Secondary | ICD-10-CM | POA: Insufficient documentation

## 2014-01-29 DIAGNOSIS — Z8639 Personal history of other endocrine, nutritional and metabolic disease: Secondary | ICD-10-CM | POA: Insufficient documentation

## 2014-01-29 DIAGNOSIS — Y929 Unspecified place or not applicable: Secondary | ICD-10-CM | POA: Insufficient documentation

## 2014-01-29 HISTORY — DX: Dehydration: E86.0

## 2014-01-29 LAB — CBC WITH DIFFERENTIAL/PLATELET
BASOS PCT: 0 % (ref 0–1)
Basophils Absolute: 0 10*3/uL (ref 0.0–0.1)
Eosinophils Absolute: 0 10*3/uL (ref 0.0–0.7)
Eosinophils Relative: 0 % (ref 0–5)
HEMATOCRIT: 33.3 % — AB (ref 36.0–46.0)
HEMOGLOBIN: 11 g/dL — AB (ref 12.0–15.0)
LYMPHS PCT: 11 % — AB (ref 12–46)
Lymphs Abs: 1.2 10*3/uL (ref 0.7–4.0)
MCH: 28.9 pg (ref 26.0–34.0)
MCHC: 33 g/dL (ref 30.0–36.0)
MCV: 87.6 fL (ref 78.0–100.0)
MONO ABS: 0.9 10*3/uL (ref 0.1–1.0)
MONOS PCT: 8 % (ref 3–12)
NEUTROS ABS: 8.8 10*3/uL — AB (ref 1.7–7.7)
Neutrophils Relative %: 81 % — ABNORMAL HIGH (ref 43–77)
Platelets: 338 10*3/uL (ref 150–400)
RBC: 3.8 MIL/uL — ABNORMAL LOW (ref 3.87–5.11)
RDW: 13.9 % (ref 11.5–15.5)
WBC: 11 10*3/uL — ABNORMAL HIGH (ref 4.0–10.5)

## 2014-01-29 LAB — BASIC METABOLIC PANEL
BUN: 17 mg/dL (ref 6–23)
CHLORIDE: 96 meq/L (ref 96–112)
CO2: 24 meq/L (ref 19–32)
CREATININE: 0.68 mg/dL (ref 0.50–1.10)
Calcium: 8.6 mg/dL (ref 8.4–10.5)
GFR calc Af Amer: >90 mL/min (ref >90)
GFR calc non Af Amer: 78 mL/min — ABNORMAL LOW (ref >90)
Glucose, Bld: 111 mg/dL — ABNORMAL HIGH (ref 70–99)
Potassium: 3.9 mEq/L (ref 3.7–5.3)
Sodium: 134 mEq/L — ABNORMAL LOW (ref 137–147)

## 2014-01-29 MED ORDER — SODIUM CHLORIDE 0.9 % IV BOLUS (SEPSIS)
500.0000 mL | Freq: Once | INTRAVENOUS | Status: AC
  Administered 2014-01-29: 500 mL via INTRAVENOUS

## 2014-01-29 NOTE — ED Notes (Signed)
Per family report: pt hx of dizziness last year in which she was dehydrated.  Pt fell today.  Pt reports her feet slipped from under her.  Pt denies LOC, injuries, pain, or dizziness at this moment.  Pt reports being able to tolerate PO fluids without emesis.

## 2014-01-29 NOTE — Discharge Instructions (Signed)
Dizziness °Dizziness is a common problem. It is a feeling of unsteadiness or lightheadedness. You may feel like you are about to faint. Dizziness can lead to injury if you stumble or fall. A person of any age group can suffer from dizziness, but dizziness is more common in older adults. °CAUSES  °Dizziness can be caused by many different things, including: °· Middle ear problems. °· Standing for too long. °· Infections. °· An allergic reaction. °· Aging. °· An emotional response to something, such as the sight of blood. °· Side effects of medicines. °· Fatigue. °· Problems with circulation or blood pressure. °· Excess use of alcohol, medicines, or illegal drug use. °· Breathing too fast (hyperventilation). °· An arrhythmia or problems with your heart rhythm. °· Low red blood cell count (anemia). °· Pregnancy. °· Vomiting, diarrhea, fever, or other illnesses that cause dehydration. °· Diseases or conditions such as Parkinson's disease, high blood pressure (hypertension), diabetes, and thyroid problems. °· Exposure to extreme heat. °DIAGNOSIS  °To find the cause of your dizziness, your caregiver may do a physical exam, lab tests, radiologic imaging scans, or an electrocardiography test (ECG).  °TREATMENT  °Treatment of dizziness depends on the cause of your symptoms and can vary greatly. °HOME CARE INSTRUCTIONS  °· Drink enough fluids to keep your urine clear or pale yellow. This is especially important in very hot weather. In the elderly, it is also important in cold weather. °· If your dizziness is caused by medicines, take them exactly as directed. When taking blood pressure medicines, it is especially important to get up slowly. °· Rise slowly from chairs and steady yourself until you feel okay. °· In the morning, first sit up on the side of the bed. When this seems okay, stand slowly while holding onto something until you know your balance is fine. °· If you need to stand in one place for a long time, be sure to  move your legs often. Tighten and relax the muscles in your legs while standing. °· If dizziness continues to be a problem, have someone stay with you for a day or two. Do this until you feel you are well enough to stay alone. Have the person call your caregiver if he or she notices changes in you that are concerning. °· Do not drive or use heavy machinery if you feel dizzy. °· Do not drink alcohol. °SEEK IMMEDIATE MEDICAL CARE IF:  °· Your dizziness or lightheadedness gets worse. °· You feel nauseous or vomit. °· You develop problems with talking, walking, weakness, or using your arms, hands, or legs. °· You are not thinking clearly or you have difficulty forming sentences. It may take a friend or family member to determine if your thinking is normal. °· You develop chest pain, abdominal pain, shortness of breath, or sweating. °· Your vision changes. °· You notice any bleeding. °· You have side effects from medicine that seems to be getting worse rather than better. °MAKE SURE YOU:  °· Understand these instructions. °· Will watch your condition. °· Will get help right away if you are not doing well or get worse. °Document Released: 02/04/2001 Document Revised: 11/03/2011 Document Reviewed: 02/28/2011 °ExitCare® Patient Information ©2014 [employer]. ° ° °Emergency Department Resource Guide °1) Find a Doctor and Pay Out of Pocket °Although you won't have to find out who is covered by your insurance plan, it is a good idea to ask around and get recommendations. You will then need to call the office and see   if the doctor you have chosen will accept you as a new patient and what types of options they offer for patients who are self-pay. Some doctors offer discounts or will set up payment plans for their patients who do not have insurance, but you will need to ask so you aren't surprised when you get to your appointment. ° °2) Contact Your Local Health Department °Not all health departments have doctors that can see  patients for sick visits, but many do, so it is worth a call to see if yours does. If you don't know where your local health department is, you can check in your phone book. The CDC also has a tool to help you locate your state's health department, and many state websites also have listings of all of their local health departments. ° °3) Find a Walk-in Clinic °If your illness is not likely to be very severe or complicated, you may want to try a walk in clinic. These are popping up all over the country in pharmacies, drugstores, and shopping centers. They're usually staffed by nurse practitioners or physician assistants that have been trained to treat common illnesses and complaints. They're usually fairly quick and inexpensive. However, if you have serious medical issues or chronic medical problems, these are probably not your best option. ° °No Primary Care Doctor: °- Call [facility] at  832-8000 - they can help you locate a primary care doctor that  accepts your insurance, provides certain services, etc. °- Physician Referral Service- 1-800-533-3463 ° °Chronic Pain Problems: °Organization         Address  Phone   Notes  °[facility]  (336) 297-2271 Patients need to be referred by their primary care doctor.  ° °Medication Assistance: °Organization         Address  Phone   Notes  °[place] [facility] [address] °[place], [place] [address] (336) 641-8030 --Must be a resident of [place] °-- Must have NO insurance coverage whatsoever (no Medicaid/ Medicare, etc.) °-- The pt. MUST have a primary care doctor that directs their care regularly and follows them in the community °  °[facility]  (866) 331-1348   °[employer]  (888) 892-1162   ° °Agencies that provide inexpensive medical care: °Organization         Address  Phone   Notes  °[facility]  (336) 832-8035   °[facility]    (336) 832-7272   °[facility] [address] °[place], [place] [address] (336) 832-4777   °[facility] [address], °[place] (336) 271-4999   °[facility]    (336) 373-0678   °[facility]    (336) 272-1050   °[facility] ° [address], [place] Phone:  (336) 832-4444, Fax:  (336) 832-4440 Hours of Operation:  9 am - 6 pm, M-F.  Also accepts Medicaid/Medicare and self-pay.  °[facility] ° [address], [place] Phone: (336) 832-3150, Fax: (336) 832-3151. Hours of Operation:  8:30 am - 5:30 pm, M-F.  Also accepts Medicaid and self-pay.  °[facility] [place] [address], [place] Phone: (336) 878-6027   °[facility] [address], [place], [place] (336)723-1848, Ext. 123 Mondays & Thursdays: 7-9 AM.  First 15 patients are seen on a first come, first serve basis. °  ° °Medicaid-accepting [place] Providers: ° °  Organization         Address  Phone   Notes  °[facility] [address], [place] (336) 641-2100 Also accepts self-pay patients.  °[facility] [address], [place] ° (336) 856-9996   °[facility] [address], [place] (336) 288-8857   °[facility] [address] [place] [address], [place] (336) 299-7000   °Veita Bland [address], [place]  ° (336) 373-1557 Only accepts [place] Access Medicaid patients after they have their name applied to their card.  ° °Self-Pay (no insurance) in [place]: ° °Organization         Address  Phone   Notes  °Sickle Cell Patients, [facility] [address], [place] (336) 832-1970   °[facility] [address], [place] (336) 832-4400   °[facility] ° [address], [place] (336) 992-4800   °[facility]/Dr. Osei-Bonsu °  [address] [place] [address], [place] or [address], [place] (336) 841-8500 Phone number for both [place] and [place] locations is the same.  °[facility] [address], [place] (336) 299-0000   °[facility] [address] [place] [address], [place] or [address] (336) 852-7530 °(336) 878-2260   °[facility] [address], [place] (336) 350-1642, phone; (336) 294-5005, fax Sees patients 1st and 3rd Saturday of every month.  Must not qualify for public or private insurance (i.e. Medicaid, Medicare, [place] Health Choice, Veterans' Benefits) • Household income should be no more than 200% of the poverty level •The clinic cannot treat you if you are pregnant or think you are pregnant • Sexually transmitted diseases are not treated at the clinic.  ° ° °Dental Care: °Organization         Address  Phone  Notes  °[facility] [address], [place] (336) 641-6152 Accepts children up to age 21 who are enrolled in Medicaid or [place] Health Choice; pregnant women with a Medicaid card; and children who have applied for Medicaid or [place] Health Choice, but were declined, whose parents can pay a reduced fee at time of service.  °[facility] [place]  [address], [place] (336) 641-7733 Accepts children up to age 21 who are enrolled in Medicaid or [place] Health Choice; pregnant women with a Medicaid card; and children who have applied for Medicaid or [place] Health Choice, but were declined, whose parents can pay a reduced fee at time of service.  °[facility] ° [address],  (336) 641-4533 Patients are seen by appointment only. Walk-ins are not accepted. [facility] will see patients 18 years of age and older. °Monday - Tuesday (8am-5pm) °Most Wednesdays (8:30-5pm) °$30 per visit, cash only  °[facility] ° [address], [place] (336) 641-4533 Patients are seen by appointment only. Walk-ins are not accepted. [facility] will see patients 18 years of age and older. °One Wednesday Evening (Monthly: Volunteer Based).  $30 per visit, cash only  °[facility]  (919) 537-3737 for adults; Children under age 4, call Graduate Pediatric Dentistry at (919) [phone]. Children aged 4-14, please call (919) [phone] to request a pediatric application. ° Dental services   are provided in all areas of dental care including fillings, crowns and bridges, complete and partial dentures, implants, gum treatment, root canals, and extractions. Preventive care is also provided. Treatment is provided to both adults and children. °Patients are selected via a lottery and there is often a waiting list. °  °[facility] [address], °[place] ° (336) 763-8833 [email or web address] °  °[facility] [address], [place], [place] (336)723-1848, Ext. 123 Second and Fourth Thursday of each month, opens at 6:30 AM; Clinic ends at 9 AM.  Patients are seen on a first-come first-served basis, and a limited number are seen during each clinic.  ° °[facility] ° [address], [place], [place] (336) 723-7904   Eligibility Requirements °You must have lived in [place], [place], or [place] counties for at least the last three months. °  You cannot be eligible for state or federal sponsored healthcare insurance, including Veterans Administration, Medicaid, or Medicare. °  You generally cannot be eligible for healthcare insurance through your employer.  °  How to apply: °Eligibility screenings are held every Tuesday and Wednesday afternoon from 1:00 pm until 4:00 pm. You do not need an appointment for the interview!  °[facility] [address], [place], [place] 336-631-2330   °[facility]  336-342-8273   °[facility]   336-703-3100   °[facility]  336-570-6415   ° °Behavioral Health Resources in the Community: °Intensive Outpatient Programs °Organization         Address  Phone  Notes  °[facility] [address], [place], [place] 336-878-6098   °[facility] [address], [place], [place] 336-832-9800   °[facility] [address], [place], [place] ° 336-882-2125   °[facility] [address],  °[place], [place] 1-800-853-5163 or 336-641-4981   °Substance Abuse Resources °Organization         Address  Phone  Notes  °[facility]  336-882-2125   °[facility]  336-784-9470   °[facility]  336-285-9073   °[facility]  336-845-3988   °Residential & Outpatient Substance Abuse Program  1-800-659-3381   °Psychological Services °Organization         Address  Phone  Notes  °[facility]  336- 832-9600   °[facility]  336- 378-7881   °[facility] [address], [place] 1-800-853-5163 or 336-641-4981   ° °Mobile Crisis Teams °Organization         Address  Phone  Notes  °[facility], Mobile Crisis Care Unit  1-877-626-1772   °Assertive °[facility] ° [address] [place], [place] 336-834-9664   °Sharon DeEsch [address] °[place] 336-554-5454   ° °Self-Help/Support Groups °Organization         Address  Phone             Notes  °[facility] - variety of support groups  336- 373-1402 Call for more information  °Narcotics Anonymous (NA), [facility] [address], °[place]  2 meetings at this location  ° °Residential Treatment Programs °Organization         Address  Phone  Notes  °[facility] [address],    °[place]  1-866-801-8205   °[facility] ° [address], [place], [place] 704-293-8524   °  [facility] [address], [place] 336-845-3988 Admissions: 8am-3pm M-F  °[facility] [address]    °[place], [place] 336-841-1104   °[facility] [address], [place], [place] 336-379-7146   °[facility] [address]  °[place], [place] 336-285-9073   °[facility] Intensive Outpatient [address], [place], [place] 336-852-3033   °[facility] [address]  °[place], [place] 1-877-615-2722 or 336-784-9470   °[facility] [address] [place], [place] 336-227-7417 Accepts Medicaid  °[facility] [address]  °[place] 1-800-659-3381 Substance Abuse/Addiction Treatment  ° °[place] Behavioral Health Resources °Organization         Address  Phone  Notes  °[facility]  (888) 581-9988   °Julie Brannon, PhD [address] [place], [place]   (336) 349-5553 or (336) 951-0000   °[facility]   [address] °[place], [place] (336) 349-4454   °[facility] [address], [place], [place] (336) 342-8316 Insurance/Medicaid/sponsorship through [facility]  °[facility] [address]                                    [place], [place] (336) 342-8316 Therapy/tele-psych/case  °[facility] [address]  ° [place], [place] (336) 349-2233    °Dr. Arfeen  (336) 349-4544   °[facility] [place]  [employer] [place] [facility] 1) [address], [place] °2) [address], [place] °3)  [address], [place] (336) 349-3220 °(336) 342-7768 ° °(336) 342-8140   °[place] [facility] (336) 342-1394 or (336) 342-3537 (After Hours)    ° ° ° °

## 2014-01-29 NOTE — ED Provider Notes (Addendum)
CSN: [id number]     Arrival date & time 01/29/14  1952 History   First MD Initiated Contact with Patient 01/29/14 2043     Chief Complaint  Patient presents with  . Fall  . Dizziness    HPI Pt had an episode of dizziness on Friday briefly.  That resolved but today she had a fall.  Pt thinks her feet slipped.  She denies LOC or any injuries.  Family was concerned because about a year ago she had a fall associated with severe dehydration.  She has not had any recent nausea or vomiting.  No pain anywhere.   Past Medical History  Diagnosis Date  . Cellulitis   . Dehydration    Past Surgical History  Procedure Laterality Date  . Tonsillectomy     No family history on file. History  Substance Use Topics  . Smoking status: Never Smoker   . Smokeless tobacco: Not on file  . Alcohol Use: No   OB History   Grav Para Term Preterm Abortions TAB SAB Ect Mult Living                 Review of Systems  All other systems reviewed and are negative.     Allergies  Review of patient's allergies indicates no known allergies.  Home Medications   Prior to Admission medications   Not on File   BP 165/78  Pulse 78  Temp(Src) 98.6 F (37 C) (Oral)  Resp 16  SpO2 96% Physical Exam  Nursing note and vitals reviewed. Constitutional: She appears well-developed and well-nourished. No distress.  HENT:  Head: Normocephalic and atraumatic.  Right Ear: External ear normal.  Left Ear: External ear normal.  Eyes: Conjunctivae are normal. Right eye exhibits no discharge. Left eye exhibits no discharge. No scleral icterus.  Neck: Neck supple. No tracheal deviation present.  Cardiovascular: Normal rate, regular rhythm and intact distal pulses.   Pulmonary/Chest: Effort normal and breath sounds normal. No stridor. No respiratory distress. She has no wheezes. She has no rales.  Abdominal: Soft. Bowel sounds are normal. She exhibits no distension. There is no tenderness. There is no rebound and no  guarding.  Musculoskeletal: She exhibits no edema and no tenderness.       Cervical back: Normal. She exhibits no tenderness.  Neurological: She is alert. She has normal strength. No cranial nerve deficit (no facial droop, extraocular movements intact, no slurred speech) or sensory deficit. She exhibits normal muscle tone. She displays no seizure activity. Coordination normal.  Skin: Skin is warm and dry. No rash noted.  Psychiatric: She has a normal mood and affect.    ED Course  Procedures (including critical care time) Labs Review Labs Reviewed  CBC WITH DIFFERENTIAL - Abnormal; Notable for the following:    WBC 11.0 (*)    RBC 3.80 (*)    Hemoglobin 11.0 (*)    HCT 33.3 (*)    Neutrophils Relative % 81 (*)    Neutro Abs 8.8 (*)    Lymphocytes Relative 11 (*)    All other components within normal limits  BASIC METABOLIC PANEL - Abnormal; Notable for the following:    Sodium 134 (*)    Glucose, Bld 111 (*)    GFR calc non Af Amer 78 (*)    All other components within normal limits      EKG Interpretation   Date/Time:  Sunday January 29 2014 20:51:52 EDT Ventricular Rate:  77 PR Interval:  174 QRS  Duration: 88 QT Interval:  411 QTC Calculation: 465 R Axis:   29 Text Interpretation:  Sinus or ectopic atrial rhythm Consider left  ventricular hypertrophy No significant change since last tracing Confirmed  by Amos Gaber  MD-J, Yaileen Hofferber ([id number]) on 01/29/2014 9:05:12 PM      MDM   Final diagnoses:  Fall   Patient had a fall earlier today. She states that her feet slipped from underneath her. Her daughter was concerned however because the patient does not fall frequently. Previously she had an episode of dehydration associated with electrolyte problems. I reviewed the records from that visit. At that time the patient's sodium levels 133 and her creatinine was 0.65.  Patient has no injuries. She has no complaints.  Pt was able to walk to the restroom.  At this time there does not  appear to be any evidence of an acute emergency medical condition and the patient appears stable for discharge with appropriate outpatient follow up.  I talked to the patient;s daughter about following up with a PCP   Linwood DibblesJon Jiyan Walkowski, MD 01/29/14 [id number]  Linwood DibblesJon Lexis Potenza, MD 01/29/14 2304

## 2016-06-07 ENCOUNTER — Emergency Department (HOSPITAL_BASED_OUTPATIENT_CLINIC_OR_DEPARTMENT_OTHER): Payer: Medicare Other

## 2016-06-07 ENCOUNTER — Encounter (HOSPITAL_BASED_OUTPATIENT_CLINIC_OR_DEPARTMENT_OTHER): Payer: Self-pay | Admitting: *Deleted

## 2016-06-07 ENCOUNTER — Emergency Department (HOSPITAL_BASED_OUTPATIENT_CLINIC_OR_DEPARTMENT_OTHER)
Admission: EM | Admit: 2016-06-07 | Discharge: 2016-06-07 | Disposition: A | Discharge status: Home / Self Care | Payer: Medicare Other | Attending: Emergency Medicine | Admitting: Emergency Medicine

## 2016-06-07 DIAGNOSIS — N3000 Acute cystitis without hematuria: Secondary | ICD-10-CM | POA: Diagnosis not present

## 2016-06-07 DIAGNOSIS — R5383 Other fatigue: Secondary | ICD-10-CM | POA: Insufficient documentation

## 2016-06-07 DIAGNOSIS — R4182 Altered mental status, unspecified: Secondary | ICD-10-CM | POA: Insufficient documentation

## 2016-06-07 LAB — CBC WITH DIFFERENTIAL/PLATELET
BASOS ABS: 0 10*3/uL (ref 0.0–0.1)
BLASTS: 0 %
Band Neutrophils: 0 %
Basophils Relative: 0 %
Eosinophils Absolute: 0.1 10*3/uL (ref 0.0–0.7)
Eosinophils Relative: 1 %
HEMATOCRIT: 37.4 % (ref 36.0–46.0)
HEMOGLOBIN: 12.2 g/dL (ref 12.0–15.0)
LYMPHS ABS: 0.6 10*3/uL — AB (ref 0.7–4.0)
Lymphocytes Relative: 9 %
MCH: 31.3 pg (ref 26.0–34.0)
MCHC: 32.6 g/dL (ref 30.0–36.0)
MCV: 95.9 fL (ref 78.0–100.0)
METAMYELOCYTES PCT: 0 %
MONOS PCT: 6 %
Monocytes Absolute: 0.4 10*3/uL (ref 0.1–1.0)
Myelocytes: 0 %
NEUTROS PCT: 84 %
Neutro Abs: 5.6 10*3/uL (ref 1.7–7.7)
PLATELETS: 181 10*3/uL (ref 150–400)
Promyelocytes Absolute: 0 %
RBC: 3.9 MIL/uL (ref 3.87–5.11)
RDW: 12.7 % (ref 11.5–15.5)
WBC: 6.7 10*3/uL (ref 4.0–10.5)
nRBC: 0 /100 WBC

## 2016-06-07 LAB — URINE MICROSCOPIC-ADD ON: RBC / HPF: NONE SEEN RBC/hpf (ref 0–5)

## 2016-06-07 LAB — URINALYSIS, ROUTINE W REFLEX MICROSCOPIC
Bilirubin Urine: NEGATIVE
Glucose, UA: NEGATIVE mg/dL
HGB URINE DIPSTICK: NEGATIVE
Ketones, ur: NEGATIVE mg/dL
NITRITE: NEGATIVE
PROTEIN: NEGATIVE mg/dL
Specific Gravity, Urine: 1.013 (ref 1.005–1.030)
pH: 7 (ref 5.0–8.0)

## 2016-06-07 LAB — COMPREHENSIVE METABOLIC PANEL
ALT: 12 U/L — AB (ref 14–54)
AST: 20 U/L (ref 15–41)
Albumin: 3.9 g/dL (ref 3.5–5.0)
Alkaline Phosphatase: 62 U/L (ref 38–126)
Anion gap: 8 (ref 5–15)
BILIRUBIN TOTAL: 0.7 mg/dL (ref 0.3–1.2)
BUN: 17 mg/dL (ref 6–20)
CHLORIDE: 103 mmol/L (ref 101–111)
CO2: 26 mmol/L (ref 22–32)
CREATININE: 0.89 mg/dL (ref 0.44–1.00)
Calcium: 8.7 mg/dL — ABNORMAL LOW (ref 8.9–10.3)
GFR calc Af Amer: >60 mL/min (ref >60)
GFR, EST NON AFRICAN AMERICAN: 57 mL/min — AB (ref >60)
Glucose, Bld: 162 mg/dL — ABNORMAL HIGH (ref 65–99)
Potassium: 4.1 mmol/L (ref 3.5–5.1)
Sodium: 137 mmol/L (ref 135–145)
Total Protein: 7.2 g/dL (ref 6.5–8.1)

## 2016-06-07 LAB — CBG MONITORING, ED: GLUCOSE-CAPILLARY: 180 mg/dL — AB (ref 65–99)

## 2016-06-07 LAB — TROPONIN I

## 2016-06-07 MED ORDER — DEXTROSE 5 % IV SOLN
1.0000 g | Freq: Once | INTRAVENOUS | Status: AC
  Administered 2016-06-07: 1 g via INTRAVENOUS
  Filled 2016-06-07: qty 10

## 2016-06-07 MED ORDER — SODIUM CHLORIDE 0.9 % IV BOLUS (SEPSIS)
250.0000 mL | Freq: Once | INTRAVENOUS | Status: AC
  Administered 2016-06-07: 250 mL via INTRAVENOUS

## 2016-06-07 MED ORDER — CEPHALEXIN 500 MG PO CAPS: 500.0000 mg | ORAL_CAPSULE | Freq: Four times a day (QID) | ORAL | 0 refills | Status: DC

## 2016-06-07 MED ORDER — SODIUM CHLORIDE 0.9 % IV SOLN
INTRAVENOUS | Status: DC
  Administered 2016-06-07: 750 mL via INTRAVENOUS

## 2016-06-07 NOTE — ED Triage Notes (Signed)
Per daughter her mother got up early this morning and ambulated to the bathroom around 7am w/o difficulty. She states that around 9:15 she called her and told that she didn't feel right. Unable to tell her date of birth and daughter states this is not her normal baseline. Patient states that she is not having any chest pain.

## 2016-06-07 NOTE — ED Provider Notes (Addendum)
[facility] Provider Note   CSN: [id number] Arrival date & time: 06/07/16  1005     History   Chief Complaint Chief Complaint  Patient presents with  . Altered Mental Status    HPI Sheila Byrd is a 82 y.o. female.  Patient brought in by daughter patient was fine yesterday. This morning patient had some type of spell where she kind of grabbed her neck area not able to explain what she was really feeling at the time. No nausea vomiting no chest pain no shortness of breath patient did not pass out or fall over. Daughter states that her mother is just not herself since this happened. Patient was able to ambulate find the bathroom at 7 in the morning. The spell occurred around 9:15. She does Saying she didn't feel right.      Past Medical History:  Diagnosis Date  . Cellulitis   . Dehydration     There are no active problems to display for this patient.   Past Surgical History:  Procedure Laterality Date  . TONSILLECTOMY      OB History    No data available       Home Medications    Prior to Admission medications   Not on File    Family History No family history on file.  Social History Social History  Substance Use Topics  . Smoking status: Never Smoker  . Smokeless tobacco: Never Used  . Alcohol use No     Allergies   Review of patient's allergies indicates no known allergies.   Review of Systems Review of Systems  Constitutional: Positive for fatigue. Negative for fever.  HENT: Negative for congestion.   Eyes: Negative for visual disturbance.  Respiratory: Negative for shortness of breath.   Cardiovascular: Negative for chest pain.  Gastrointestinal: Negative for abdominal pain.  Genitourinary: Negative for dysuria.  Musculoskeletal: Negative for back pain.  Skin: Negative for rash.  Neurological: Negative for headaches.  Psychiatric/Behavioral: Negative for confusion.     Physical Exam Updated Vital Signs BP 169/79 (BP  Location: Right Arm)   Pulse 74   Temp 97.8 F (36.6 C) (Oral)   Resp 22   Ht 5\' 2"  (1.575 m)   Wt 49.9 kg   SpO2 98%   BMI 20.12 kg/m   Physical Exam  Constitutional: She appears well-developed and well-nourished. No distress.  HENT:  Head: Normocephalic and atraumatic.  Because membranes slightly dry.  Eyes: EOM are normal. Pupils are equal, round, and reactive to light.  Neck: Normal range of motion. Neck supple.  Cardiovascular: Normal rate, regular rhythm and normal heart sounds.   No murmur heard. Pulmonary/Chest: Effort normal and breath sounds normal. No respiratory distress.  Abdominal: Soft. Bowel sounds are normal. There is no tenderness.  Musculoskeletal: Normal range of motion.  Neurological: She is alert. No cranial nerve deficit. She exhibits normal muscle tone. Coordination normal.  Skin: Skin is warm. No erythema.  Nursing note and vitals reviewed.    ED Treatments / Results  Labs (all labs ordered are listed, but only abnormal results are displayed) Labs Reviewed  URINALYSIS, ROUTINE W REFLEX MICROSCOPIC (NOT AT [facility]) - Abnormal; Notable for the following:       Result Value   Leukocytes, UA SMALL (*)    All other components within normal limits  CBC WITH DIFFERENTIAL/PLATELET - Abnormal; Notable for the following:    Lymphs Abs 0.6 (*)    All other components within normal limits  COMPREHENSIVE  METABOLIC PANEL - Abnormal; Notable for the following:    Glucose, Bld 162 (*)    Calcium 8.7 (*)    ALT 12 (*)    GFR calc non Af Amer 57 (*)    All other components within normal limits  URINE MICROSCOPIC-ADD ON - Abnormal; Notable for the following:    Squamous Epithelial / LPF 0-5 (*)    Bacteria, UA MANY (*)    All other components within normal limits  CBG MONITORING, ED - Abnormal; Notable for the following:    Glucose-Capillary 180 (*)    All other components within normal limits  URINE CULTURE  TROPONIN I    EKG  EKG  Interpretation  Date/Time:  Saturday June 07 2016 10:24:07 EDT Ventricular Rate:  76 PR Interval:    QRS Duration: 97 QT Interval:  405 QTC Calculation: 456 R Axis:   33 Text Interpretation:  Sinus rhythm Interpretation limited secondary to artifact No significant change since last tracing Confirmed by Deno Sida  MD, Baelyn Doring [phone]) on 06/07/2016 10:32:57 AM       Radiology Dg Chest 2 View  Result Date: 06/07/2016 CLINICAL DATA:  Patient states "she didn't feel right" since 9:15 this morning. Unable to tell her date of birth and per daughter, this is not her normal baseline. Altered mental status. No pertinent past medical or surgical history per patient and patient's daughter. EXAM: CHEST  2 VIEW COMPARISON:  Chest x-ray dated 11/13/2012 FINDINGS: Heart size and mediastinal contours are stable. Atherosclerotic changes again noted at the aortic arch. Lungs are clear. Mild degenerative spurring again noted within the thoracic spine. No acute or suspicious osseous finding. IMPRESSION: 1. No active cardiopulmonary disease.  No evidence of pneumonia. 2. Aortic atherosclerosis. Electronically Signed   By: Bary Richard M.D.   On: 06/07/2016 11:57   Ct Head Wo Contrast  Result Date: 06/07/2016 CLINICAL DATA:  Patient states "she didn't feel right" since 9:15 this morning. Unable to tell her date of birth and per daughter, this is not her normal baseline. Altered mental status. No pertinent past medical or surgical history per patient and patient's daughter. EXAM: CT HEAD WITHOUT CONTRAST TECHNIQUE: Contiguous axial images were obtained from the base of the skull through the vertex without intravenous contrast. COMPARISON:  CT head dated 11/12/2012. FINDINGS: Brain: Again noted is generalized age related parenchymal atrophy with commensurate dilatation of the ventricles and sulci. Again noted is an old infarct within the upper left parietal lobe, at the vertex, with associated encephalomalacia.  Again noted are chronic small vessel ischemic changes within the bilateral periventricular and subcortical white matter regions. There is no mass, hemorrhage, edema or other evidence of acute parenchymal abnormality. No extra-axial hemorrhage. Vascular: There are chronic calcified atherosclerotic changes of the large vessels at the skull base. No unexpected hyperdense vessel. Skull: Normal. Negative for fracture or focal lesion. Sinuses/Orbits: No acute finding. Other: None. IMPRESSION: 1. No acute findings.  No intracranial mass, hemorrhage or edema. 2. Atrophy and chronic ischemic changes, as detailed above. Electronically Signed   By: Bary Richard M.D.   On: 06/07/2016 11:59    Procedures Procedures (including critical care time)  Medications Ordered in ED Medications  0.9 %  sodium chloride infusion (750 mLs Intravenous New Bag/Given 06/07/16 1121)  cefTRIAXone (ROCEPHIN) 1 g in dextrose 5 % 50 mL IVPB (not administered)  sodium chloride 0.9 % bolus 250 mL (250 mLs Intravenous New Bag/Given 06/07/16 1119)     Initial Impression / Assessment and  Plan / ED Course  I have reviewed the triage vital signs and the nursing notes.  Pertinent labs & imaging results that were available during my care of the patient were reviewed by me and considered in my medical decision making (see chart for details).  Clinical Course    Patient brought in by daughter for a spell that occurred earlier today where patient states she didn't feel right denied any chest pain patient described her neck. No specific symptoms to explain what occurred.  Workup here CT negative chest x-ray negative for pneumonia pneumothorax pulmonary edema. Labs I sniffing abnormality. Urinalysis still pending. If urinalysis is negative patient is stable for discharge home. Based on CT scan or some chronic changes which would explain some component of dementia.  Troponin was negative. EKG without any acute changes.  Final Clinical  Impressions(s) / ED Diagnoses   Final diagnoses:  Altered mental status, unspecified altered mental status type  Acute cystitis without hematuria    New Prescriptions New Prescriptions   No medications on file     Vanetta Mulders, MD 06/07/16 1450  Urinalysis suggestive of your early urinary tract infection. Will treat with 1 g Rocephin here in continue her on Keflex. Patient will be discharged home.    Vanetta Mulders, MD 06/07/16 1513

## 2016-06-07 NOTE — ED Notes (Addendum)
IV site infiltration reported to Dr. Adela LankFloyd.  OK to discharge, return for redness or streaks up arm.  Leave dressing on till am and use warm compresses as needed for comfort. Patient denies any pain.

## 2016-06-07 NOTE — ED Notes (Signed)
CBG 180. Barth Kirkseri, RN notified

## 2016-06-07 NOTE — Discharge Instructions (Signed)
Return for any new or worse symptoms. Take the Keflex for the next 7 days for the urinary tract infection. Urine culture has been sent. Make an appointment to follow-up with a doctor she does qualify for Medicare. Would be important for her to find a primary care doctor. Return here for any new or worse symptoms.

## 2016-06-07 NOTE — ED Notes (Signed)
Patient denies pain and is resting comfortably.  

## 2016-06-09 LAB — URINE CULTURE

## 2016-07-07 ENCOUNTER — Inpatient Hospital Stay (HOSPITAL_BASED_OUTPATIENT_CLINIC_OR_DEPARTMENT_OTHER)
Admission: EM | Admit: 2016-07-07 | Discharge: 2016-07-09 | DRG: 641 | Disposition: A | Discharge status: Home / Self Care | Payer: Medicare Other | Attending: Internal Medicine | Admitting: Internal Medicine

## 2016-07-07 ENCOUNTER — Encounter (HOSPITAL_BASED_OUTPATIENT_CLINIC_OR_DEPARTMENT_OTHER): Payer: Self-pay | Admitting: Emergency Medicine

## 2016-07-07 ENCOUNTER — Observation Stay (HOSPITAL_COMMUNITY): Payer: Medicare Other

## 2016-07-07 ENCOUNTER — Emergency Department (HOSPITAL_BASED_OUTPATIENT_CLINIC_OR_DEPARTMENT_OTHER): Payer: Medicare Other

## 2016-07-07 DIAGNOSIS — E860 Dehydration: Principal | ICD-10-CM

## 2016-07-07 DIAGNOSIS — R42 Dizziness and giddiness: Secondary | ICD-10-CM | POA: Diagnosis not present

## 2016-07-07 DIAGNOSIS — I639 Cerebral infarction, unspecified: Secondary | ICD-10-CM

## 2016-07-07 DIAGNOSIS — Z8673 Personal history of transient ischemic attack (TIA), and cerebral infarction without residual deficits: Secondary | ICD-10-CM | POA: Diagnosis not present

## 2016-07-07 DIAGNOSIS — I6522 Occlusion and stenosis of left carotid artery: Secondary | ICD-10-CM | POA: Diagnosis not present

## 2016-07-07 LAB — CBC WITH DIFFERENTIAL/PLATELET
BASOS ABS: 0 10*3/uL (ref 0.0–0.1)
Basophils Relative: 0 %
Eosinophils Absolute: 0.1 10*3/uL (ref 0.0–0.7)
Eosinophils Relative: 2 %
HEMATOCRIT: 37.6 % (ref 36.0–46.0)
Hemoglobin: 12.1 g/dL (ref 12.0–15.0)
LYMPHS PCT: 39 %
Lymphs Abs: 1.8 10*3/uL (ref 0.7–4.0)
MCH: 30.6 pg (ref 26.0–34.0)
MCHC: 32.2 g/dL (ref 30.0–36.0)
MCV: 95.2 fL (ref 78.0–100.0)
Monocytes Absolute: 0.4 10*3/uL (ref 0.1–1.0)
Monocytes Relative: 8 %
NEUTROS ABS: 2.3 10*3/uL (ref 1.7–7.7)
NEUTROS PCT: 51 %
Platelets: 197 10*3/uL (ref 150–400)
RBC: 3.95 MIL/uL (ref 3.87–5.11)
RDW: 12.6 % (ref 11.5–15.5)
WBC: 4.6 10*3/uL (ref 4.0–10.5)

## 2016-07-07 LAB — COMPREHENSIVE METABOLIC PANEL
ALT: 12 U/L — AB (ref 14–54)
AST: 21 U/L (ref 15–41)
Albumin: 4.1 g/dL (ref 3.5–5.0)
Alkaline Phosphatase: 60 U/L (ref 38–126)
Anion gap: 9 (ref 5–15)
BILIRUBIN TOTAL: 0.5 mg/dL (ref 0.3–1.2)
BUN: 15 mg/dL (ref 6–20)
CHLORIDE: 100 mmol/L — AB (ref 101–111)
CO2: 26 mmol/L (ref 22–32)
CREATININE: 0.86 mg/dL (ref 0.44–1.00)
Calcium: 8.8 mg/dL — ABNORMAL LOW (ref 8.9–10.3)
GFR calc Af Amer: >60 mL/min (ref >60)
GFR, EST NON AFRICAN AMERICAN: 59 mL/min — AB (ref >60)
GLUCOSE: 142 mg/dL — AB (ref 65–99)
Potassium: 4.1 mmol/L (ref 3.5–5.1)
Sodium: 135 mmol/L (ref 135–145)
TOTAL PROTEIN: 7.3 g/dL (ref 6.5–8.1)

## 2016-07-07 LAB — I-STAT CG4 LACTIC ACID, ED: LACTIC ACID, VENOUS: 2 mmol/L — AB (ref 0.5–1.9)

## 2016-07-07 LAB — URINE MICROSCOPIC-ADD ON: WBC, UA: NONE SEEN WBC/hpf (ref 0–5)

## 2016-07-07 LAB — URINALYSIS, ROUTINE W REFLEX MICROSCOPIC
Bilirubin Urine: NEGATIVE
GLUCOSE, UA: NEGATIVE mg/dL
KETONES UR: NEGATIVE mg/dL
LEUKOCYTES UA: NEGATIVE
Nitrite: NEGATIVE
PH: 7.5 (ref 5.0–8.0)
Protein, ur: NEGATIVE mg/dL
SPECIFIC GRAVITY, URINE: 1.008 (ref 1.005–1.030)

## 2016-07-07 MED ORDER — SODIUM CHLORIDE 0.9 % IV SOLN
INTRAVENOUS | Status: DC
  Administered 2016-07-07: 18:00:00 via INTRAVENOUS

## 2016-07-07 MED ORDER — SENNOSIDES-DOCUSATE SODIUM 8.6-50 MG PO TABS: 1.0000 | ORAL_TABLET | Freq: Every evening | ORAL | Status: DC | PRN

## 2016-07-07 MED ORDER — ASPIRIN EC 81 MG PO TBEC
81.0000 mg | DELAYED_RELEASE_TABLET | Freq: Every day | ORAL | Status: DC
  Administered 2016-07-07 – 2016-07-09 (×3): 81 mg via ORAL
  Filled 2016-07-07 (×3): qty 1

## 2016-07-07 MED ORDER — ENOXAPARIN SODIUM 40 MG/0.4ML ~~LOC~~ SOLN
40.0000 mg | SUBCUTANEOUS | Status: DC
  Administered 2016-07-07 – 2016-07-09 (×3): 40 mg via SUBCUTANEOUS
  Filled 2016-07-07 (×3): qty 0.4

## 2016-07-07 MED ORDER — STROKE: EARLY STAGES OF RECOVERY BOOK
Freq: Once | Status: AC
  Administered 2016-07-07: 18:00:00

## 2016-07-07 MED ORDER — SODIUM CHLORIDE 0.9 % IV BOLUS (SEPSIS)
1000.0000 mL | Freq: Once | INTRAVENOUS | Status: AC
  Administered 2016-07-07: 1000 mL via INTRAVENOUS

## 2016-07-07 NOTE — ED Notes (Signed)
Attempted to obtain urine specimen-patient unable to provide

## 2016-07-07 NOTE — ED Notes (Signed)
ED Provider at bedside. 

## 2016-07-07 NOTE — Progress Notes (Signed)
   [facility]/T[facility]. Patient with facial droop and extremity contracture with weakness. History of previous stroke. Vertigo at rest. Neuro consult [facility]. Despite history of previous strokes, which patient denies ever being told she's had, she's never had a full stroke workup. Neuro would need to be notified when patient arrives on the floor.   Shelly Flattenavid Merrell, MD [facility] 07/07/2016, 1:14 PM

## 2016-07-07 NOTE — ED Notes (Signed)
Patient to CT.

## 2016-07-07 NOTE — ED Triage Notes (Signed)
Per daughter patient began having dizziness this morning around 0900.  States that she has had similar event about 1 month ago.  Patient noted to have left sided facial droop but family member states this is her normal.

## 2016-07-07 NOTE — H&P (Signed)
History and Physical    Sheila BickersRuth Byrd UJW:[id number] DOB: [birth year] DOA: 07/07/2016  Referring MD/NP/PA: [facility] PCP: No Pcp Per Pt (Inactive) Outpatient Specialists:  [facility]  Chief Complaint: dizziness  HPI: Sheila BickersRuth Byrd is a 82 y.o. female with medical history significant of dehydration and a recent UTI-- does not take medication at home.  Daughter states she has episodes of dizziness at home that usually means she is dehydrated.  She normally goes to the ER for fluid and this resolves.  Today, the ER doc stated that it appeared she had a facial droop.  Daughter did not endorse.  Patient does not have teeth on right side.  Right arm has been weak since 2014 when "her electrolytes" were out of order.    Patient normally gets up around the house on her own.  Does not use walker/wheelchair.  Daughter states patient does not drink enough water on a daily basis and gets dehydrated and this presents as dizziness.  Patient received IVF in ER and dizziness has resolved.    Patient was referred to hospital for TIA work up. Neuro consulted in ER   Review of Systems: all systems reviewed, negative unless stated above in HPI   Past Medical History:  Diagnosis Date  . Cellulitis   . Dehydration     Past Surgical History:  Procedure Laterality Date  . TONSILLECTOMY       reports that she has never smoked. She has never used smokeless tobacco. She reports that she does not drink alcohol or use drugs.  No Known Allergies   Family hx: +CHF   Prior to Admission medications   Medication Sig Start Date End Date Taking? Authorizing Provider  cephALEXin (KEFLEX) 500 MG capsule Take 1 capsule (500 mg total) by mouth 4 (four) times daily. 06/07/16   Vanetta MuldersScott Zackowski, MD    Physical Exam: Vitals:   07/07/16 1315 07/07/16 1430 07/07/16 1521 07/07/16 1600  BP: 147/75 108/93  (!) 163/80  Pulse:  83    Resp: 17 22  20   Temp:   97.4 F (36.3 C) 98.2 F (36.8  C)  TempSrc:    Oral  SpO2: 99% 100%  97%  Weight:      Height:          Constitutional: elderly appearing Vitals:   07/07/16 1315 07/07/16 1430 07/07/16 1521 07/07/16 1600  BP: 147/75 108/93  (!) 163/80  Pulse:  83    Resp: 17 22  20   Temp:   97.4 F (36.3 C) 98.2 F (36.8 C)  TempSrc:    Oral  SpO2: 99% 100%  97%  Weight:      Height:       Eyes: PERRL, lids and conjunctivae normal ENMT: Mucous membranes are moist. Posterior pharynx clear of any exudate or lesions.Normal dentition.  Neck: normal, supple, no masses, no thyromegaly Respiratory: clear to auscultation bilaterally, no wheezing, no crackles. Normal respiratory effort. No accessory muscle use.  Cardiovascular: Regular rate and rhythm, + murmur. No extremity edema. 2+ pedal pulses. No carotid bruits.  Abdomen: no tenderness, no masses palpated. No hepatosplenomegaly. Bowel sounds positive.  Musculoskeletal: no clubbing / cyanosis. No joint deformity upper and lower extremities. Good ROM, no contractures. Normal muscle tone.  Skin: no rashes, lesions, ulcers. No induration Neurologic: CN 2-12 grossly intact. No teeth on right side, right arm contracted   Labs on Admission: I have personally reviewed following labs and imaging studies  CBC:  Recent Labs  Lab 07/07/16 1027  WBC 4.6  NEUTROABS 2.3  HGB 12.1  HCT 37.6  MCV 95.2  PLT 197   Basic Metabolic Panel:  Recent Labs Lab 07/07/16 1027  NA 135  K 4.1  CL 100*  CO2 26  GLUCOSE 142*  BUN 15  CREATININE 0.86  CALCIUM 8.8*   GFR: Estimated Creatinine Clearance: 33.7 mL/min (by C-G formula based on SCr of 0.86 mg/dL). Liver Function Tests:  Recent Labs Lab 07/07/16 1027  AST 21  ALT 12*  ALKPHOS 60  BILITOT 0.5  PROT 7.3  ALBUMIN 4.1   No results for input(s): LIPASE, AMYLASE in the last 168 hours. No results for input(s): AMMONIA in the last 168 hours. Coagulation Profile: No results for input(s): INR, PROTIME in the last 168  hours. Cardiac Enzymes: No results for input(s): CKTOTAL, CKMB, CKMBINDEX, TROPONINI in the last 168 hours. BNP (last 3 results) No results for input(s): PROBNP in the last 8760 hours. HbA1C: No results for input(s): HGBA1C in the last 72 hours. CBG: No results for input(s): GLUCAP in the last 168 hours. Lipid Profile: No results for input(s): CHOL, HDL, LDLCALC, TRIG, CHOLHDL, LDLDIRECT in the last 72 hours. Thyroid Function Tests: No results for input(s): TSH, T4TOTAL, FREET4, T3FREE, THYROIDAB in the last 72 hours. Anemia Panel: No results for input(s): VITAMINB12, FOLATE, FERRITIN, TIBC, IRON, RETICCTPCT in the last 72 hours. Urine analysis:    Component Value Date/Time   COLORURINE YELLOW 07/07/2016 1315   APPEARANCEUR CLEAR 07/07/2016 1315   LABSPEC 1.008 07/07/2016 1315   PHURINE 7.5 07/07/2016 1315   GLUCOSEU NEGATIVE 07/07/2016 1315   HGBUR LARGE (A) 07/07/2016 1315   BILIRUBINUR NEGATIVE 07/07/2016 1315   KETONESUR NEGATIVE 07/07/2016 1315   PROTEINUR NEGATIVE 07/07/2016 1315   UROBILINOGEN 0.2 11/13/2012 0219   NITRITE NEGATIVE 07/07/2016 1315   LEUKOCYTESUR NEGATIVE 07/07/2016 1315   Sepsis Labs: Invalid input(s): PROCALCITONIN, LACTICIDVEN No results found for this or any previous visit (from the past 240 hour(s)).   Radiological Exams on Admission: Ct Head Wo Contrast  Result Date: 07/07/2016 CLINICAL DATA:  82 year old female with dizziness. Initial encounter. EXAM: CT HEAD WITHOUT CONTRAST TECHNIQUE: Contiguous axial images were obtained from the base of the skull through the vertex without intravenous contrast. COMPARISON:  06/07/2016 and 11/12/2012 head CT. 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 Chronic microvascular changes. No intracranial hemorrhage or CT evidence of large acute infarct. No intracranial mass lesion noted on this unenhanced exam. Vascular: Vascular calcifications. Skull: No acute abnormality.  Sinuses/Orbits: Visualized orbits unremarkable. Visualized paranasal sinuses and mastoid air cells are clear. Other: Negative IMPRESSION: No intracranial hemorrhage or CT evidence of large acute infarct. 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 Chronic microvascular changes. Global atrophy without hydrocephalus. Electronically Signed   By: Lacy DuverneySteven  Olson M.D.   On: 07/07/2016 10:58    EKG: Independently reviewed. Sinus   Assessment/Plan Active Problems:   Dizziness  dehydration  Dizziness due to dehydration -IVF -will do TIA/CVA work up -PT/OT -MRI -echo -neuro consulted in ER -ASA 81mg  on no meds at home   DVT prophylaxis: lovenox Code Status: full Family Communication: patient Disposition Plan: obs-- home in AM    JESSICA U [facility] Center[facility]ANN DO [facility] Pager [phone]  If 7PM-7AM, please contact night-coverage [email or web address] Password [email or web address]  07/07/2016, 4:47 PM

## 2016-07-07 NOTE — ED Provider Notes (Signed)
[facility] Provider Note   CSN: [id number] Arrival date & time: 07/07/16  1006     History   Chief Complaint Chief Complaint  Patient presents with  . Dizziness    HPI Michel BickersRuth Caywood is a 82 y.o. female.  HPI [age] old female with past medical history as below who presents with acute onset of vertigo. Patient has been seen multiple times in the ED for similar symptoms, most recently on 10/14. She states that she was laying down when she experienced acute onset of lightheadedness and dizziness sensation. She then began vomiting. She has never been formally diagnosed with vertigo and denies any change with position or head position. Of note, since her last episode, she has had left facial droop as well as right arm weakness which has never been evaluated. She denies any known history of strokes. She denies any worsening of this weakness or facial droop, as well as no dysarthria or dysphasia during these episodes. Denies any recent fevers or chills.  Past Medical History:  Diagnosis Date  . Cellulitis   . Dehydration     Patient Active Problem List   Diagnosis Date Noted  . Dizziness 07/07/2016    Past Surgical History:  Procedure Laterality Date  . TONSILLECTOMY      OB History    No data available       Home Medications    Prior to Admission medications   Medication Sig Start Date End Date Taking? Authorizing Provider  cephALEXin (KEFLEX) 500 MG capsule Take 1 capsule (500 mg total) by mouth 4 (four) times daily. 06/07/16   Vanetta MuldersScott Zackowski, MD    Family History History reviewed. No pertinent family history.  Social History Social History  Substance Use Topics  . Smoking status: Never Smoker  . Smokeless tobacco: Never Used  . Alcohol use No     Allergies   Patient has no known allergies.   Review of Systems Review of Systems  Constitutional: Positive for fatigue. Negative for chills and fever.  HENT: Negative for congestion and rhinorrhea.     Eyes: Negative for visual disturbance.  Respiratory: Negative for cough, shortness of breath and wheezing.   Cardiovascular: Negative for chest pain and leg swelling.  Gastrointestinal: Negative for abdominal pain, diarrhea, nausea and vomiting.  Genitourinary: Negative for dysuria and flank pain.  Musculoskeletal: Negative for neck pain and neck stiffness.  Skin: Negative for rash and wound.  Allergic/Immunologic: Negative for immunocompromised state.  Neurological: Positive for dizziness, facial asymmetry and weakness. Negative for syncope and headaches.  All other systems reviewed and are negative.    Physical Exam Updated Vital Signs BP (!) 163/80 (BP Location: Left Arm)   Pulse 83 Comment: Simultaneous filing. User may not have seen previous data.  Temp 98.2 F (36.8 C) (Oral)   Resp 20   Ht 5' (1.524 m)   Wt 110 lb (49.9 kg)   SpO2 97%   BMI 21.48 kg/m   Physical Exam  Constitutional: She is oriented to person, place, and time. She appears well-developed and well-nourished. No distress.  HENT:  Head: Normocephalic and atraumatic.  Eyes: Conjunctivae are normal.  Neck: Neck supple.  Cardiovascular: Normal rate, regular rhythm and normal heart sounds.  Exam reveals no friction rub.   No murmur heard. Pulmonary/Chest: Effort normal and breath sounds normal. No respiratory distress. She has no wheezes. She has no rales.  Abdominal: She exhibits no distension.  Musculoskeletal: She exhibits no edema.  Neurological: She is alert  and oriented to person, place, and time. She exhibits normal muscle tone.  Skin: Skin is warm. Capillary refill takes less than 2 seconds.  Psychiatric: She has a normal mood and affect.  Nursing note and vitals reviewed.   Neurological Exam:  Mental Status: Alert and oriented to person, place, and time. Attention and concentration normal. Speech clear. Recent memory is intact. Cranial Nerves: Visual fields intact to confrontation in all  quadrants bilaterally. EOMI and PERRLA. No nystagmus noted. Facial sensation intact at forehead, maxillary cheek, and chin/mandible bilaterally. No weakness of masticatory muscles. Mild flattening of left nasolabial fold and weakness on closing eyes. Hearing grossly normal to finer rub. Uvula is midline, and palate elevates symmetrically. Normal SCM and trapezius strength. Tongue midline without fasciculations Motor: Muscle strength 5/5 in proximal and distal LE bilaterally. Right grip strength 4/5 with increased tone and contraction of RUE. No pronator drift. Reflexes: 2+ and symmetrical in all four extremities.  Sensation: Intact to light touch in upper and lower extremities distally bilaterally.  Gait: Deferred Coordination: Mild dysmetria on right.   ED Treatments / Results  Labs (all labs ordered are listed, but only abnormal results are displayed) Labs Reviewed  COMPREHENSIVE METABOLIC PANEL - Abnormal; Notable for the following:       Result Value   Chloride 100 (*)    Glucose, Bld 142 (*)    Calcium 8.8 (*)    ALT 12 (*)    GFR calc non Af Amer 59 (*)    All other components within normal limits  URINALYSIS, ROUTINE W REFLEX MICROSCOPIC (NOT AT [facility]) - Abnormal; Notable for the following:    Hgb urine dipstick LARGE (*)    All other components within normal limits  URINE MICROSCOPIC-ADD ON - Abnormal; Notable for the following:    Squamous Epithelial / LPF 0-5 (*)    Bacteria, UA RARE (*)    All other components within normal limits  I-STAT CG4 LACTIC ACID, ED - Abnormal; Notable for the following:    Lactic Acid, Venous 2.00 (*)    All other components within normal limits  CBC WITH DIFFERENTIAL/PLATELET  HEMOGLOBIN A1C  LIPID PANEL  CBC  COMPREHENSIVE METABOLIC PANEL    EKG  EKG Interpretation None       Radiology Ct Head Wo Contrast  Result Date: 07/07/2016 CLINICAL DATA:  82 year old female with dizziness. Initial encounter. EXAM: CT HEAD WITHOUT CONTRAST  TECHNIQUE: Contiguous axial images were obtained from the base of the skull through the vertex without intravenous contrast. COMPARISON:  06/07/2016 and 11/12/2012 head CT. 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 Chronic microvascular changes. No intracranial hemorrhage or CT evidence of large acute infarct. No intracranial mass lesion noted on this unenhanced exam. Vascular: Vascular calcifications. Skull: No acute abnormality. Sinuses/Orbits: Visualized orbits unremarkable. Visualized paranasal sinuses and mastoid air cells are clear. Other: Negative IMPRESSION: No intracranial hemorrhage or CT evidence of large acute infarct. 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 Chronic microvascular changes. Global atrophy without hydrocephalus. Electronically Signed   By: Lacy DuverneySteven  Olson M.D.   On: 07/07/2016 10:58    Procedures Procedures (including critical care time)  Medications Ordered in ED Medications   stroke: mapping our early stages of recovery book (not administered)  0.9 %  sodium chloride infusion (not administered)  senna-docusate (Senokot-S) tablet 1 tablet (not administered)  enoxaparin (LOVENOX) injection 40 mg (not administered)  aspirin EC tablet 81 mg (not administered)  sodium  chloride 0.9 % bolus 1,000 mL (0 mLs Intravenous Stopped 07/07/16 1154)     Initial Impression / Assessment and Plan / ED Course  I have reviewed the triage vital signs and the nursing notes.  Pertinent labs & imaging results that were available during my care of the patient were reviewed by me and considered in my medical decision making (see chart for details).  Clinical Course   82 year old female with past medical history as above who presents with acute onset of vertigo. Symptoms are now resolved with no new focal neurological deficits. On exam, patient noted to have mild left facial droop, right arm weakness and contracture of  right hand. I'm concerned that she has likely had previous strokes with possible recurrent TIA versus orthostasis versus dehydration. No seizure-like activity, although initial episode in 2014 raises concern for possible seizure. Will obtain CT head, broad labs, and reassess.  CT head shows old infarcts with no acute changes. These likely explain her neurological findings. However, given her intermittent vertigo, this raises concern for recurrent TIA. I discussed the case with neurology. Although recurrent TIA somewhat unlikely, she is certainly high risk and has never received workup for her multiple strokes. This merits evaluation with further imaging and workup. Will admit to the hospitalist service with neurology consultation.  Final Clinical Impressions(s) / ED Diagnoses   Final diagnoses:  Dizziness  Cerebrovascular accident (CVA), unspecified mechanism (HCC)      Shaune Pollack, MD 07/07/16 1743

## 2016-07-07 NOTE — ED Notes (Signed)
Patient c/o cramping in legs.  MD made aware.

## 2016-07-07 NOTE — ED Triage Notes (Signed)
MD at bedside. 

## 2016-07-07 NOTE — Consult Note (Signed)
Neurology Consult Note  Reason for Consultation: Possible TIA/stroke  Requesting provider: Marlin Canary, DO  CC: dizziness  HPI: This is an 82 year old right-handed woman who states that she woke up this morning and noticed that she was dizzy. Her daughter is present at the bedside and reports that she actually gotten up and was in her usual state of health. She got up and had some breakfast and then went back to her room to lay down. At about 9 or 9:30, she called out to her daughter and complained of feeling dizzy.  The patient reports that she felt light headed. She denied any sense of motion or spinning but did report that anytime her head moved her symptoms got worse. She did not have any other symptoms. She specifically denies weakness, numbness, speech changes, vision loss, double vision, problems with coordination, or changes in balance. She has had some difficulty using her right arm since 2014 but this is unchanged today. Her daughter also reports that she has had some asymmetry of her mouth for some time and has attributed this to the fact that she is missing some of her teeth.  Her daughter reports that in 2014, she had an episode where she complained of feeling dizzy and then started shaking and "fell out." Her daughter reports that she was evaluated at the time and was found to have some electrolyte abnormalities. Her daughter reports that she had difficulty using the right arm ever since that time. She has had a couple of other episodes of dizziness. These have always been attributed to dehydration and she has been given IV fluids with resolution of symptoms. The patient is generally ambulatory around the house but may have difficulty ambulating for longer distances. Her daughter reports that her vision is somewhat poor and she has glasses but does not use them because they no longer fit.  She presented to [facility] emergency department earlier today. CT scan of the head was  obtained and showed evidence of prior infarctions but no acute pathology. Because of her recurrent episodes of dizziness and the fact that she has had evidence of prior infarctions on CT without formal diagnosis or workup for stroke, the patient was transferred to [facility] for further assessment due to the ED physician concern for TIA/stroke.  PMH:  Past Medical History:  Diagnosis Date  . Cellulitis   . Dehydration     PSH:  Past Surgical History:  Procedure Laterality Date  . TONSILLECTOMY      Family history: None reported  Social history:  She is widowed. She currently lives with her daughter. She denies any history of tobacco, alcohol, or illicit drug use.   Current inpatient meds:  Current Facility-Administered Medications  Medication Dose Route Frequency Provider Last Rate Last Dose  .  stroke: mapping our early stages of recovery book   Does not apply Once Jessica U Vann, DO      . 0.9 %  sodium chloride infusion   Intravenous Continuous Joseph Art, DO      . aspirin EC tablet 81 mg  81 mg Oral Daily Jessica U Vann, DO      . enoxaparin (LOVENOX) injection 40 mg  40 mg Subcutaneous Q24H Jessica U Vann, DO      . senna-docusate (Senokot-S) tablet 1 tablet  1 tablet Oral QHS PRN Joseph Art, DO        Allergies: No Known Allergies  ROS: As per HPI. A full 14-point  review of systems was performed and is otherwise unremarkable.   PE:  BP (!) 163/80 (BP Location: Left Arm)   Pulse 83 Comment: Simultaneous filing. User may not have seen previous data.  Temp 98.2 F (36.8 C) (Oral)   Resp 20   Ht 5' (1.524 m)   Wt 49.9 kg (110 lb)   SpO2 97%   BMI 21.48 kg/m   General: Thin elderly woman lying in bed, no acute distress. Speech mildly dysarthric but she is partly edentulous. No aphasia. Follows commands briskly. Affect is flat. Comportment is normal.  HEENT: Normocephalic. Neck supple without LAD. MMM, OP clear. She is partly edentulous. Sclerae anicteric.  No conjunctival injection.  CV: Regular, 2/6 systolic murmur heard best at the lower left sternal border. Carotid pulses full and symmetric, no bruits. Distal pulses 2+ and symmetric.  Lungs: CTAB.  Abdomen: Soft, non-distended, non-tender. Bowel sounds present x4.  Extremities: No C/C/E. Neuro:  CN: Pupils are equal and round. They are symmetrically reactive from 3-->2 mm. EOMI notable for some breakup of smooth pursuits without nystagmus. No reported diplopia. Facial sensation is intact to light touch. There is some mild asymmetry of the left mouth but she appears to have normal mobility. Hearing is intact to conversational voice. Palate elevates symmetrically and uvula is midline. Voice is normal in tone, pitch and quality. Bilateral SCM and trapezii are 5/5. Tongue is midline with normal bulk and mobility.  Motor: Normal bulk for age. Tone is normal with overlying mitgehen and gegenhalten paratonia. She has a spastic paresis of the right arm with 4/5 strength in the right grip and right finger extensors and 4+/5 strength in right wrist extension, biceps, triceps. No tremor or other abnormal movements. No drift.  Sensation: Intact to light touch.  DTRs: 2+ with the exception of 3+ in the right upper extremity. Toes mute on the right, downgoing on the left.  Coordination: Finger-to-nose is limited by weakness on the right but without dysmetria bilaterally. Heel-to-shin is without dysmetria bilaterally. Finger taps are limited by weakness on the right.   Labs:  Lab Results  Component Value Date   WBC 4.6 07/07/2016   HGB 12.1 07/07/2016   HCT 37.6 07/07/2016   PLT 197 07/07/2016   GLUCOSE 142 (H) 07/07/2016   ALT 12 (L) 07/07/2016   AST 21 07/07/2016   NA 135 07/07/2016   K 4.1 07/07/2016   CL 100 (L) 07/07/2016   CREATININE 0.86 07/07/2016   BUN 15 07/07/2016   CO2 26 07/07/2016   Lactate 2.00 Urinalysis notable for large hemoglobin  Imaging:  I have personally and independently  reviewed CT scan of the head without contrast from today. This shows a focal area of encephalomalacia in the left parietal lobe, consistent with previous infarction. She has an old lacunar infarct in the right caudate head. Severe chronic small vessel ischemic changes are noted in the bihemispheric hemispheric white matter and pons. No obvious acute abnormality seen. Atherosclerotic calcifications are seen in the vertebral and carotid arteries.   Assessment and Plan:  1. Vertigo: The patient appears to have suffered an episode of transient vertigo earlier today. This is her third such episode since 2014 by report. Imaging today showed evidence of previous infarctions but nothing acute. The patient and her daughter report that she has never been told that she has had a stroke. This is prompted transfer for further evaluation. At this point, her vertigo has resolved. Her daughter reports that her vertigo always seems to  occur when she is dehydrated. This is certainly possible and if true would suggest the potential for vertebrobasilar stenosis or insufficiency with vertigo resulting from decreased cerebral perfusion in the setting of dehydration. Will proceed with MRI and MRA of the brain for further evaluation. May also consider EEG if imaging is unrevealing.  2. Cerebrovascular disease: She has evidence of previous infarcts on imaging though has never been told that she has suffered a stroke. I suspect that her right arm weakness is related to a stroke that was not diagnosed. Recommend further evaluation with MRI scan brain, MRA head, transthoracic echocardiogram, carotid Dopplers, hemoglobin A1c, and fasting lipids. She reports that she does not take daily aspirin and that should be instituted at 81 mg daily. She may also benefit from the addition of a statin. Ensure aggressive control of blood pressure, glucose, and lipids as needed.  3. Right arm weakness: This is chronic, likely related to her remote  stroke. No acute issues at this time.  This was discussed with the patient and her daughter at the bedside. They are in agreement with the plan as noted. There were given the opportunity to ask any questions and these were addressed to their satisfaction.  Thank you for this consultation. Please feel free to call with any questions or concerns.The stroke team will assume care of this patient beginning 07/08/16.

## 2016-07-07 NOTE — ED Notes (Signed)
Spoke to [facility] regarding admission.  Dr. Erma HeritageIsaacs did speak to Dr. Margot AblesMerrill regarding admission and he accepted.

## 2016-07-08 ENCOUNTER — Observation Stay (HOSPITAL_BASED_OUTPATIENT_CLINIC_OR_DEPARTMENT_OTHER): Payer: Medicare Other

## 2016-07-08 ENCOUNTER — Observation Stay (HOSPITAL_COMMUNITY): Payer: Medicare Other

## 2016-07-08 ENCOUNTER — Encounter (HOSPITAL_COMMUNITY): Payer: Self-pay | Admitting: Radiology

## 2016-07-08 DIAGNOSIS — G459 Transient cerebral ischemic attack, unspecified: Secondary | ICD-10-CM

## 2016-07-08 DIAGNOSIS — E860 Dehydration: Secondary | ICD-10-CM

## 2016-07-08 DIAGNOSIS — R42 Dizziness and giddiness: Secondary | ICD-10-CM | POA: Diagnosis not present

## 2016-07-08 DIAGNOSIS — I6522 Occlusion and stenosis of left carotid artery: Secondary | ICD-10-CM | POA: Diagnosis not present

## 2016-07-08 DIAGNOSIS — Z8673 Personal history of transient ischemic attack (TIA), and cerebral infarction without residual deficits: Secondary | ICD-10-CM | POA: Diagnosis not present

## 2016-07-08 LAB — COMPREHENSIVE METABOLIC PANEL
ALT: 10 U/L — ABNORMAL LOW (ref 14–54)
ANION GAP: 6 (ref 5–15)
AST: 20 U/L (ref 15–41)
Albumin: 3.2 g/dL — ABNORMAL LOW (ref 3.5–5.0)
Alkaline Phosphatase: 50 U/L (ref 38–126)
BILIRUBIN TOTAL: 0.4 mg/dL (ref 0.3–1.2)
BUN: 9 mg/dL (ref 6–20)
CO2: 27 mmol/L (ref 22–32)
Calcium: 8.5 mg/dL — ABNORMAL LOW (ref 8.9–10.3)
Chloride: 105 mmol/L (ref 101–111)
Creatinine, Ser: 0.89 mg/dL (ref 0.44–1.00)
GFR calc Af Amer: >60 mL/min (ref >60)
GFR, EST NON AFRICAN AMERICAN: 57 mL/min — AB (ref >60)
Glucose, Bld: 93 mg/dL (ref 65–99)
POTASSIUM: 3.6 mmol/L (ref 3.5–5.1)
Sodium: 138 mmol/L (ref 135–145)
TOTAL PROTEIN: 6 g/dL — AB (ref 6.5–8.1)

## 2016-07-08 LAB — VAS US CAROTID
LCCADDIAS: 7 cm/s
LCCADSYS: 42 cm/s
LCCAPDIAS: 6 cm/s
LEFT ECA DIAS: -17 cm/s
LICADDIAS: -18 cm/s
LICAPDIAS: 132 cm/s
Left CCA prox sys: 54 cm/s
Left ICA dist sys: -43 cm/s
Left ICA prox sys: 519 cm/s
RCCADSYS: -165 cm/s
RCCAPDIAS: -23 cm/s
RCCAPSYS: -100 cm/s
RIGHT ECA DIAS: 42 cm/s
RIGHT VERTEBRAL DIAS: -9 cm/s

## 2016-07-08 LAB — LIPID PANEL
CHOL/HDL RATIO: 2.8 ratio
CHOLESTEROL: 172 mg/dL (ref 0–200)
HDL: 61 mg/dL (ref >40)
LDL Cholesterol: 98 mg/dL (ref 0–99)
TRIGLYCERIDES: 65 mg/dL (ref <150)
VLDL: 13 mg/dL (ref 0–40)

## 2016-07-08 LAB — CBC
HEMATOCRIT: 34.1 % — AB (ref 36.0–46.0)
Hemoglobin: 11.1 g/dL — ABNORMAL LOW (ref 12.0–15.0)
MCH: 30.3 pg (ref 26.0–34.0)
MCHC: 32.6 g/dL (ref 30.0–36.0)
MCV: 93.2 fL (ref 78.0–100.0)
PLATELETS: 178 10*3/uL (ref 150–400)
RBC: 3.66 MIL/uL — ABNORMAL LOW (ref 3.87–5.11)
RDW: 13 % (ref 11.5–15.5)
WBC: 3.8 10*3/uL — ABNORMAL LOW (ref 4.0–10.5)

## 2016-07-08 LAB — ECHOCARDIOGRAM COMPLETE
HEIGHTINCHES: 60 in
Weight: 1760 oz

## 2016-07-08 MED ORDER — ASPIRIN 81 MG PO TBEC: 81.0000 mg | DELAYED_RELEASE_TABLET | Freq: Every day | ORAL | Status: AC

## 2016-07-08 MED ORDER — SODIUM CHLORIDE 0.9 % IV SOLN
INTRAVENOUS | Status: DC
  Administered 2016-07-08: 22:00:00 via INTRAVENOUS

## 2016-07-08 MED ORDER — IOPAMIDOL (ISOVUE-370) INJECTION 76%
INTRAVENOUS | Status: AC
  Administered 2016-07-08: 50 mL
  Filled 2016-07-08: qty 50

## 2016-07-08 NOTE — Evaluation (Signed)
Physical Therapy Evaluation and Discharge Patient Details Name: Michel BickersRuth Currier MRN: [id number] DOB: 18-Feb-1927 Today's Date: 07/08/2016   History of Present Illness  pt presents with episode of dizziness and has been worked up for CVA.  pt with old infarcts noted on imaging, but had not been part of PMH.  pt with hx of Cellulitis.    Clinical Impression  Pt appears to be at baseline level of mobility per pt and daughter's report.  Pt with chronic weakness on R side from previous "spell" per pt and daughter with R UE with increaed tone and decreased ROM.  Pt at this time is able to mobilize without A and demonstrates good awareness of safety.  Pt's daughter tends to talk over pt and has lots of question about getting pt IVs for hydration at home.  Advised daughter to speak with MD.  No further PT needs at this time, will sign off.      Follow Up Recommendations No PT follow up;Supervision - Intermittent    Equipment Recommendations  None recommended by PT    Recommendations for Other Services       Precautions / Restrictions Precautions Precautions: None Restrictions Weight Bearing Restrictions: No      Mobility  Bed Mobility Overal bed mobility: Modified Independent             General bed mobility comments: pt needs increased time, but is able to complete without A or cueing.    Transfers Overall transfer level: Modified independent Equipment used: None                Ambulation/Gait Ambulation/Gait assistance: Modified independent (Device/Increase time) Ambulation Distance (Feet): 200 Feet Assistive device: None Gait Pattern/deviations: Step-through pattern;Decreased stride length;Trunk flexed     General Gait Details: pt moves slowly and indicates this is baseline for her.  pt somewhat shuffles R LE and indicates this too is normal for her.  No LOB or A needed.    Stairs            Wheelchair Mobility    Modified Rankin (Stroke Patients  Only) Modified Rankin (Stroke Patients Only) Pre-Morbid Rankin Score: Moderate disability Modified Rankin: Moderate disability     Balance Overall balance assessment: Needs assistance Sitting-balance support: No upper extremity supported;Feet supported Sitting balance-Leahy Scale: Good     Standing balance support: No upper extremity supported;During functional activity Standing balance-Leahy Scale: Fair                               Pertinent Vitals/Pain Pain Assessment: No/denies pain    Home Living Family/patient expects to be discharged to:: Private residence Living Arrangements: Children Available Help at Discharge: Family;Available 24 hours/day Type of Home: House Home Access: Ramped entrance     Home Layout: One level Home Equipment: Bedside commode      Prior Function Level of Independence: Needs assistance   Gait / Transfers Assistance Needed: Ambulates short distances due to daughter's fear of pt fatigue if taken out to a store or other longer distance.    ADL's / Homemaking Assistance Needed: Daughter performs homemaking tasks and A pt with reaching her feet for ADLs.          Hand Dominance        Extremity/Trunk Assessment   Upper Extremity Assessment: RUE deficits/detail RUE Deficits / Details: Chronic increased tone and decreaed ROM and strength from old CVA.  Sensation intact.  Lower Extremity Assessment: RLE deficits/detail RLE Deficits / Details: Mild decreased strength from old CVA.  ROM and sensation intact.      Cervical / Trunk Assessment: Kyphotic  Communication   Communication: No difficulties  Cognition Arousal/Alertness: Awake/alert Behavior During Therapy: WFL for tasks assessed/performed Overall Cognitive Status: Within Functional Limits for tasks assessed                      General Comments      Exercises     Assessment/Plan    PT Assessment Patent does not need any further PT services   PT Problem List            PT Treatment Interventions      PT Goals (Current goals can be found in the Care Plan section)  Acute Rehab PT Goals Patient Stated Goal: Home PT Goal Formulation: All assessment and education complete, DC therapy    Frequency     Barriers to discharge        Co-evaluation               End of Session Equipment Utilized During Treatment: Gait belt Activity Tolerance: Patient tolerated treatment well Patient left: in chair;with call bell/phone within reach;with family/visitor present Nurse Communication: Mobility status    Functional Assessment Tool Used: Clinical Judgement Functional Limitation: Mobility: Walking and moving around Mobility: Walking and Moving Around Current Status [phone](G8978): 0 percent impaired, limited or restricted Mobility: Walking and Moving Around Goal Status [phone](G8979): 0 percent impaired, limited or restricted Mobility: Walking and Moving Around Discharge Status [phone](G8980): 0 percent impaired, limited or restricted    Time: 0940-1010 PT Time Calculation (min) (ACUTE ONLY): 30 min   Charges:   PT Evaluation $PT Eval Low Complexity: 1 Procedure PT Treatments $Gait Training: 8-22 mins   PT G Codes:   PT G-Codes **NOT FOR INPATIENT CLASS** Functional Assessment Tool Used: Clinical Judgement Functional Limitation: Mobility: Walking and moving around Mobility: Walking and Moving Around Current Status ([id number](G8978): 0 percent impaired, limited or restricted Mobility: Walking and Moving Around Goal Status ([id number](G8979): 0 percent impaired, limited or restricted Mobility: Walking and Moving Around Discharge Status ([id number](G8980): 0 percent impaired, limited or restricted    Sunny SchleinRitenour, Erric Machnik F, [place] CarolinaPT [phone] 07/08/2016, 10:25 AM

## 2016-07-08 NOTE — Progress Notes (Addendum)
*  PRELIMINARY RESULTS* Vascular Ultrasound Carotid Duplex (Doppler) has been completed.  Preliminary findings: Findings consistent with 1-39 percent stenosis involving the right internal carotid artery.  Findings consistent with greater than 80 percent stenosis involving the left internal carotid artery.  Right vertebral artery in patent and antegrade, left vertebral was difficult to evaluate- likely antegrade.  Results called to patients nurse, Laquita, at 15:45. Chauncey FischerCharlotte C Cloie Wooden 07/08/2016, 4:23 PM

## 2016-07-08 NOTE — Progress Notes (Signed)
PROGRESS NOTE    Sheila Byrd  ZOX:[id number] DOB: February 01, 1927 DOA: 07/07/2016 PCP: No Pcp Per Pt (Inactive)   Outpatient Specialists:     Brief Narrative:  Sheila Byrd is a 82 y.o. female with medical history significant of dehydration and a recent UTI-- does not take medication at home.  Daughter states she has episodes of dizziness at home that usually means she is dehydrated.  She normally goes to the ER for fluid and this resolves.  Today, the ER doc stated that it appeared she had a facial droop.  Daughter did not endorse.  Patient does not have teeth on right side.  Right arm has been weak since 2014 when "her electrolytes" were out of order.    Patient normally gets up around the house on her own.  Does not use walker/wheelchair.  Daughter states patient does not drink enough water on a daily basis and gets dehydrated and this presents as dizziness.  Patient received IVF in ER and dizziness has resolved.    Patient was referred to hospital for TIA work up. Neuro consulted in ER   Assessment & Plan:   Active Problems:   Dizziness dehydration  Dizziness due to dehydration -MRI negative -echo pending -carotid pending -no PT -CTA pending -ASA long term -needs to establish with PCP to get periodic IVF      DVT prophylaxis:  Lovenox   Code Status: Full Code   Family Communication: Daughter at bedside  Disposition Plan:     Consultants:   neuro     Subjective: No further dizziness  Objective: Vitals:   07/08/16 0200 07/08/16 0447 07/08/16 0944 07/08/16 1350  BP: (!) 152/63 (!) 157/62 (!) 149/59 (!) 161/62  Pulse: 64 62 69 72  Resp: 16 16 18 18   Temp: 98.2 F (36.8 C) 98.4 F (36.9 C) 98.3 F (36.8 C) 98.2 F (36.8 C)  TempSrc: Oral Oral Oral Oral  SpO2: 99% 98% 96% 97%  Weight:      Height:        Intake/Output Summary (Last 24 hours) at 07/08/16 1542 Last data filed at 07/08/16 0330  Gross per 24 hour  Intake            932.5 ml    Output                0 ml  Net            932.5 ml   Filed Weights   07/07/16 1010  Weight: 49.9 kg (110 lb)    Examination:  General exam: Appears calm and comfortable  Respiratory system: Clear to auscultation. Respiratory effort normal. Cardiovascular system: S1 & S2 heard, RRR. No JVD, murmurs, rubs, gallops or clicks. No pedal edema.    Data Reviewed: I have personally reviewed following labs and imaging studies  CBC:  Recent Labs Lab 07/07/16 1027 07/08/16 0529  WBC 4.6 3.8*  NEUTROABS 2.3  --   HGB 12.1 11.1*  HCT 37.6 34.1*  MCV 95.2 93.2  PLT 197 178   Basic Metabolic Panel:  Recent Labs Lab 07/07/16 1027 07/08/16 0529  NA 135 138  K 4.1 3.6  CL 100* 105  CO2 26 27  GLUCOSE 142* 93  BUN 15 9  CREATININE 0.86 0.89  CALCIUM 8.8* 8.5*   GFR: Estimated Creatinine Clearance: 32.6 mL/min (by C-G formula based on SCr of 0.89 mg/dL). Liver Function Tests:  Recent Labs Lab 07/07/16 1027 07/08/16 0529  AST 21 20  ALT  12* 10*  ALKPHOS 60 50  BILITOT 0.5 0.4  PROT 7.3 6.0*  ALBUMIN 4.1 3.2*   No results for input(s): LIPASE, AMYLASE in the last 168 hours. No results for input(s): AMMONIA in the last 168 hours. Coagulation Profile: No results for input(s): INR, PROTIME in the last 168 hours. Cardiac Enzymes: No results for input(s): CKTOTAL, CKMB, CKMBINDEX, TROPONINI in the last 168 hours. BNP (last 3 results) No results for input(s): PROBNP in the last 8760 hours. HbA1C: No results for input(s): HGBA1C in the last 72 hours. CBG: No results for input(s): GLUCAP in the last 168 hours. Lipid Profile:  Recent Labs  07/08/16 0529  CHOL 172  HDL 61  LDLCALC 98  TRIG 65  CHOLHDL 2.8   Thyroid Function Tests: No results for input(s): TSH, T4TOTAL, FREET4, T3FREE, THYROIDAB in the last 72 hours. Anemia Panel: No results for input(s): VITAMINB12, FOLATE, FERRITIN, TIBC, IRON, RETICCTPCT in the last 72 hours. Urine analysis:    Component  Value Date/Time   COLORURINE YELLOW 07/07/2016 1315   APPEARANCEUR CLEAR 07/07/2016 1315   LABSPEC 1.008 07/07/2016 1315   PHURINE 7.5 07/07/2016 1315   GLUCOSEU NEGATIVE 07/07/2016 1315   HGBUR LARGE (A) 07/07/2016 1315   BILIRUBINUR NEGATIVE 07/07/2016 1315   KETONESUR NEGATIVE 07/07/2016 1315   PROTEINUR NEGATIVE 07/07/2016 1315   UROBILINOGEN 0.2 11/13/2012 0219   NITRITE NEGATIVE 07/07/2016 1315   LEUKOCYTESUR NEGATIVE 07/07/2016 1315    )No results found for this or any previous visit (from the past 240 hour(s)).    Anti-infectives    None       Radiology Studies: Dg Chest 2 View  Result Date: 07/07/2016 CLINICAL DATA:  CVA EXAM: CHEST  2 VIEW COMPARISON:  06/07/2016 chest radiograph. FINDINGS: Stable cardiomediastinal silhouette with normal heart size and aortic atherosclerosis. No pneumothorax. No pleural effusion. Lungs appear clear, with no acute consolidative airspace disease and no pulmonary edema. IMPRESSION: No active cardiopulmonary disease. Aortic atherosclerosis. Electronically Signed   By: Delbert PhenixJason A Poff M.D.   On: 07/07/2016 18:42   Ct Head Wo Contrast  Result Date: 07/07/2016 CLINICAL DATA:  82 year old female with dizziness. Initial encounter. EXAM: CT HEAD WITHOUT CONTRAST TECHNIQUE: Contiguous axial images were obtained from the base of the skull through the vertex without intravenous contrast. COMPARISON:  06/07/2016 and 11/12/2012 head CT. 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 Chronic microvascular changes. No intracranial hemorrhage or CT evidence of large acute infarct. No intracranial mass lesion noted on this unenhanced exam. Vascular: Vascular calcifications. Skull: No acute abnormality. Sinuses/Orbits: Visualized orbits unremarkable. Visualized paranasal sinuses and mastoid air cells are clear. Other: Negative IMPRESSION: No intracranial hemorrhage or CT evidence of large acute infarct. 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 Chronic microvascular changes. Global atrophy without hydrocephalus. Electronically Signed   By: Lacy DuverneySteven  Olson M.D.   On: 07/07/2016 10:58   Mr Brain Wo Contrast  Result Date: 07/08/2016 CLINICAL DATA:  Initial evaluation for transient vertigo. EXAM: MRI HEAD WITHOUT CONTRAST MRA HEAD WITHOUT CONTRAST TECHNIQUE: Multiplanar, multiecho pulse sequences of the brain and surrounding structures were obtained without intravenous contrast. Angiographic images of the head were obtained using MRA technique without contrast. COMPARISON:  Prior CT from 07/07/2016. FINDINGS: MRI HEAD FINDINGS Brain: Diffuse prominence of the CSF containing spaces compatible generalized cerebral atrophy. Patchy T2/FLAIR hyperintensity within the periventricular and deep white matter both cerebral hemispheres most consistent with chronic small vessel ischemic disease, mild for age.  Chronic microvascular ischemic changes present within the pons is well. Encephalomalacia within the left parietal lobe compatible with remote left MCA territory infarct. Scatter remote lacunar infarcts present within the bilateral basal ganglia as well. Small remote right cerebellar infarcts present. No abnormal foci of restricted diffusion to suggest acute or subacute ischemia. Gray-white matter differentiation maintained. No evidence for acute or chronic intracranial hemorrhage. No mass lesion, midline shift or mass effect. Ventricular prominence related to global parenchymal volume loss of hydrocephalus. No extra-axial fluid collection. Major dural sinuses are grossly patent. Pituitary gland within normal limits. Vascular: Major intracranial vascular flow voids are maintained. Skull and upper cervical spine: Craniocervical junction normal. Visualized upper cervical spine within normal limits. Bone marrow signal intensity normal. No scalp soft tissue abnormality. Sinuses/Orbits: Globes and orbital soft tissues  within normal limits. Mild scattered mucosal thickening within the ethmoidal air cells. Paranasal sinuses are otherwise clear. Smaller right mastoid effusion noted. Inner ear structures grossly normal. MRA HEAD FINDINGS ANTERIOR CIRCULATION: Distal cervical right ICA is widely patent. Petrous, cavernous, and supraclinoid segments of the right ICA also widely patent without flow-limiting stenosis. Distal cervical left ICA it is diminutive and attenuated as compared to right. Petrous and cavernous left ICA attenuated but patent without flow-limiting stenosis. There is moderate to severe narrowing at the supraclinoid left ICA/left ICA terminus (series 5, image 77). A1 segments patent. Anterior communicating artery normal. Anterior cerebral arteries patent to their distal aspects. M1 segments patent without stenosis or occlusion. MCA bifurcations normal. Distal MCA branches well opacified and symmetric. POSTERIOR CIRCULATION: Vertebral arteries patent to the vertebrobasilar junction. Left vertebral artery dominant. Posterior inferior cerebral arteries patent proximally. Basilar artery widely patent to its distal aspect. Superior cerebral arteries patent proximally. Right PCA arises from the basilar artery and is well opacified to its distal aspect. There is a fetal type left PCA supplied via a left posterior communicating artery. The left PCA is attenuated as compared to the right due to the attenuated left ICA. No aneurysm or vascular malformation. IMPRESSION: MRI HEAD IMPRESSION: 1. No acute intracranial process identified. 2. Remote left MCA territory infarct, with additional small remote right cerebellar infarcts and remote lacunar infarcts involving the bilateral basal ganglia. 3. Generalized age-related cerebral atrophy with mild chronic small vessel ischemic disease. MRA HEAD IMPRESSION: 1. Attenuated flow within the left ICA as compared to the right. Dedicated imaging of the neck to evaluate for proximal  stenosis is recommended. 2. Moderate to severe stenosis of the supraclinoid left ICA/left ICA terminus. 3. Fetal type left PCA. Left PCA is slightly attenuated secondary to the attenuated flow within the left ICA. Electronically Signed   By: Rise Mu M.D.   On: 07/08/2016 05:17   Mr Maxine Glenn Head/brain WG Cm  Result Date: 07/08/2016 CLINICAL DATA:  Initial evaluation for transient vertigo. EXAM: MRI HEAD WITHOUT CONTRAST MRA HEAD WITHOUT CONTRAST TECHNIQUE: Multiplanar, multiecho pulse sequences of the brain and surrounding structures were obtained without intravenous contrast. Angiographic images of the head were obtained using MRA technique without contrast. COMPARISON:  Prior CT from 07/07/2016. FINDINGS: MRI HEAD FINDINGS Brain: Diffuse prominence of the CSF containing spaces compatible generalized cerebral atrophy. Patchy T2/FLAIR hyperintensity within the periventricular and deep white matter both cerebral hemispheres most consistent with chronic small vessel ischemic disease, mild for age. Chronic microvascular ischemic changes present within the pons is well. Encephalomalacia within the left parietal lobe compatible with remote left MCA territory infarct. Scatter remote lacunar infarcts present within the bilateral basal ganglia as  well. Small remote right cerebellar infarcts present. No abnormal foci of restricted diffusion to suggest acute or subacute ischemia. Gray-white matter differentiation maintained. No evidence for acute or chronic intracranial hemorrhage. No mass lesion, midline shift or mass effect. Ventricular prominence related to global parenchymal volume loss of hydrocephalus. No extra-axial fluid collection. Major dural sinuses are grossly patent. Pituitary gland within normal limits. Vascular: Major intracranial vascular flow voids are maintained. Skull and upper cervical spine: Craniocervical junction normal. Visualized upper cervical spine within normal limits. Bone marrow  signal intensity normal. No scalp soft tissue abnormality. Sinuses/Orbits: Globes and orbital soft tissues within normal limits. Mild scattered mucosal thickening within the ethmoidal air cells. Paranasal sinuses are otherwise clear. Smaller right mastoid effusion noted. Inner ear structures grossly normal. MRA HEAD FINDINGS ANTERIOR CIRCULATION: Distal cervical right ICA is widely patent. Petrous, cavernous, and supraclinoid segments of the right ICA also widely patent without flow-limiting stenosis. Distal cervical left ICA it is diminutive and attenuated as compared to right. Petrous and cavernous left ICA attenuated but patent without flow-limiting stenosis. There is moderate to severe narrowing at the supraclinoid left ICA/left ICA terminus (series 5, image 77). A1 segments patent. Anterior communicating artery normal. Anterior cerebral arteries patent to their distal aspects. M1 segments patent without stenosis or occlusion. MCA bifurcations normal. Distal MCA branches well opacified and symmetric. POSTERIOR CIRCULATION: Vertebral arteries patent to the vertebrobasilar junction. Left vertebral artery dominant. Posterior inferior cerebral arteries patent proximally. Basilar artery widely patent to its distal aspect. Superior cerebral arteries patent proximally. Right PCA arises from the basilar artery and is well opacified to its distal aspect. There is a fetal type left PCA supplied via a left posterior communicating artery. The left PCA is attenuated as compared to the right due to the attenuated left ICA. No aneurysm or vascular malformation. IMPRESSION: MRI HEAD IMPRESSION: 1. No acute intracranial process identified. 2. Remote left MCA territory infarct, with additional small remote right cerebellar infarcts and remote lacunar infarcts involving the bilateral basal ganglia. 3. Generalized age-related cerebral atrophy with mild chronic small vessel ischemic disease. MRA HEAD IMPRESSION: 1. Attenuated flow  within the left ICA as compared to the right. Dedicated imaging of the neck to evaluate for proximal stenosis is recommended. 2. Moderate to severe stenosis of the supraclinoid left ICA/left ICA terminus. 3. Fetal type left PCA. Left PCA is slightly attenuated secondary to the attenuated flow within the left ICA. Electronically Signed   By: Rise MuBenjamin  McClintock M.D.   On: 07/08/2016 05:17        Scheduled Meds: . aspirin EC  81 mg Oral Daily  . enoxaparin (LOVENOX) injection  40 mg Subcutaneous Q24H   Continuous Infusions: . sodium chloride 75 mL/hr at 07/08/16 0448     LOS: 0 days    Time spent: 15 min    Penny Frisbie U Murl Zogg, DO [facility] Pager [phone]  If 7PM-7AM, please contact night-coverage [email or web address] Password [email or web address] 07/08/2016, 3:42 PM

## 2016-07-08 NOTE — Care Management Obs Status (Deleted)
MEDICARE OBSERVATION STATUS NOTIFICATION   Patient Details  Name: Sheila Byrd MRN: [id number] Date of Birth: [birth year]   Medicare Observation Status Notification Given:   (Obs letter explained, Patient's daughter is upset, raised her voice at the patient's nurse, refused to sign observation letter, offered to answer patient and daughter's questions about the letter)    Antony HasteBennett, Maurisio Ruddy Harris, RN 07/08/2016, 6:37 PM

## 2016-07-08 NOTE — Care Management Obs Status (Signed)
MEDICARE OBSERVATION STATUS NOTIFICATION   Patient Details  Name: Michel BickersRuth Guerrant MRN: [id number] Date of Birth: 1927/01/07   Medicare Observation Status Notification Given:  Yes (Obs letter explained, Patient's daughter is upset, raised her voice at the patient's nurse, refused to sign observation letter, offered to answer patient and daughter's questions about the letter)    Antony HasteBennett, Alilah Mcmeans Harris, RN 07/08/2016, 6:37 PM

## 2016-07-08 NOTE — Progress Notes (Signed)
  Echocardiogram 2D Echocardiogram has been performed.  Leta JunglingCooper, Alara Daniel M 07/08/2016, 3:14 PM

## 2016-07-08 NOTE — Progress Notes (Signed)
STROKE TEAM PROGRESS NOTE   HISTORY OF PRESENT ILLNESS (per record) This is an 82 year old right-handed woman who states that she woke up this morning and noticed that she was dizzy. Her daughter is present at the bedside and reports that she actually gotten up and was in her usual state of health. She got up and had some breakfast and then went back to her room to lay down. At about 9 or 9:30, she called out to her daughter and complained of feeling dizzy.  The patient reports that she felt light headed. She denied any sense of motion or spinning but did report that anytime her head moved her symptoms got worse. She did not have any other symptoms. She specifically denies weakness, numbness, speech changes, vision loss, double vision, problems with coordination, or changes in balance. She has had some difficulty using her right arm since 2014 but this is unchanged today. Her daughter also reports that she has had some asymmetry of her mouth for some time and has attributed this to the fact that she is missing some of her teeth.  Her daughter reports that in 2014, she had an episode where she complained of feeling dizzy and then started shaking and "fell out." Her daughter reports that she was evaluated at the time and was found to have some electrolyte abnormalities. Her daughter reports that she had difficulty using the right arm ever since that time. She has had a couple of other episodes of dizziness. These have always been attributed to dehydration and she has been given IV fluids with resolution of symptoms. The patient is generally ambulatory around the house but may have difficulty ambulating for longer distances. Her daughter reports that her vision is somewhat poor and she has glasses but does not use them because they no longer fit.  She presented to [facility] department earlier today. CT scan of the head was obtained and showed evidence of prior infarctions but no acute  pathology. Because of her recurrent episodes of dizziness and the fact that she has had evidence of prior infarctions on CT without formal diagnosis or workup for stroke, the patient was [facility] due to the ED physician concern for TIA/stroke.   Patient was not administered IV t-PA. She was admitted for further evaluation and treatment.   SUBJECTIVE (INTERVAL HISTORY)  Patient is unable to give history. She has multiple episodes of vertigo and dizziness which are usually brought on when she is dehydrated. She admits to not taking enough fluids. She denies any prior diagnosis of strokes but CT scan shows multiple old lacunar infarcts.   OBJECTIVE Temp:  [97.4 F (36.3 C)-98.4 F (36.9 C)] 98.4 F (36.9 C) (11/14 0447) Pulse Rate:  [62-92] 62 (11/14 0447) Cardiac Rhythm: Normal sinus rhythm (11/13 1904) Resp:  [16-25] 16 (11/14 0447) BP: (108-166)/(54-137) 157/62 (11/14 0447) SpO2:  [92 %-100 %] 98 % (11/14 0447) Weight:  [49.9 kg (110 lb)] 49.9 kg (110 lb) (11/13 1010)  CBC:  Recent Labs Lab 07/07/16 1027 07/08/16 0529  WBC 4.6 3.8*  NEUTROABS 2.3  --   HGB 12.1 11.1*  HCT 37.6 34.1*  MCV 95.2 93.2  PLT 197 178    Basic Metabolic Panel:  Recent Labs Lab 07/07/16 1027 07/08/16 0529  NA 135 138  K 4.1 3.6  CL 100* 105  CO2 26 27  GLUCOSE 142* 93  BUN 15 9  CREATININE 0.86 0.89  CALCIUM 8.8* 8.5*  Lipid Panel:    Component Value Date/Time   CHOL 172 07/08/2016 0529   TRIG 65 07/08/2016 0529   HDL 61 07/08/2016 0529   CHOLHDL 2.8 07/08/2016 0529   VLDL 13 07/08/2016 0529   LDLCALC 98 07/08/2016 0529   HgbA1c: No results found for: HGBA1C Urine Drug Screen: No results found for: LABOPIA, COCAINSCRNUR, LABBENZ, AMPHETMU, THCU, LABBARB    IMAGING  Dg Chest 2 View  Result Date: 07/07/2016 CLINICAL DATA:  CVA EXAM: CHEST  2 VIEW COMPARISON:  06/07/2016 chest radiograph. FINDINGS: Stable cardiomediastinal silhouette  with normal heart size and aortic atherosclerosis. No pneumothorax. No pleural effusion. Lungs appear clear, with no acute consolidative airspace disease and no pulmonary edema. IMPRESSION: No active cardiopulmonary disease. Aortic atherosclerosis. Electronically Signed   By: Delbert Phenix M.D.   On: 07/07/2016 18:42   Ct Head Wo Contrast  Result Date: 07/07/2016 CLINICAL DATA:  82 year old female with dizziness. Initial encounter. EXAM: CT HEAD WITHOUT CONTRAST TECHNIQUE: Contiguous axial images were obtained from the base of the skull through the vertex without intravenous contrast. COMPARISON:  06/07/2016 and 11/12/2012 head CT. 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 Chronic microvascular changes. No intracranial hemorrhage or CT evidence of large acute infarct. No intracranial mass lesion noted on this unenhanced exam. Vascular: Vascular calcifications. Skull: No acute abnormality. Sinuses/Orbits: Visualized orbits unremarkable. Visualized paranasal sinuses and mastoid air cells are clear. Other: Negative IMPRESSION: No intracranial hemorrhage or CT evidence of large acute infarct. 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 Chronic microvascular changes. Global atrophy without hydrocephalus. Electronically Signed   By: Lacy Duverney M.D.   On: 07/07/2016 10:58   Mr Brain Wo Contrast  Result Date: 07/08/2016 CLINICAL DATA:  Initial evaluation for transient vertigo. EXAM: MRI HEAD WITHOUT CONTRAST MRA HEAD WITHOUT CONTRAST TECHNIQUE: Multiplanar, multiecho pulse sequences of the brain and surrounding structures were obtained without intravenous contrast. Angiographic images of the head were obtained using MRA technique without contrast. COMPARISON:  Prior CT from 07/07/2016. FINDINGS: MRI HEAD FINDINGS Brain: Diffuse prominence of the CSF containing spaces compatible generalized cerebral atrophy. Patchy T2/FLAIR hyperintensity  within the periventricular and deep white matter both cerebral hemispheres most consistent with chronic small vessel ischemic disease, mild for age. Chronic microvascular ischemic changes present within the pons is well. Encephalomalacia within the left parietal lobe compatible with remote left MCA territory infarct. Scatter remote lacunar infarcts present within the bilateral basal ganglia as well. Small remote right cerebellar infarcts present. No abnormal foci of restricted diffusion to suggest acute or subacute ischemia. Gray-white matter differentiation maintained. No evidence for acute or chronic intracranial hemorrhage. No mass lesion, midline shift or mass effect. Ventricular prominence related to global parenchymal volume loss of hydrocephalus. No extra-axial fluid collection. Major dural sinuses are grossly patent. Pituitary gland within normal limits. Vascular: Major intracranial vascular flow voids are maintained. Skull and upper cervical spine: Craniocervical junction normal. Visualized upper cervical spine within normal limits. Bone marrow signal intensity normal. No scalp soft tissue abnormality. Sinuses/Orbits: Globes and orbital soft tissues within normal limits. Mild scattered mucosal thickening within the ethmoidal air cells. Paranasal sinuses are otherwise clear. Smaller right mastoid effusion noted. Inner ear structures grossly normal. MRA HEAD FINDINGS ANTERIOR CIRCULATION: Distal cervical right ICA is widely patent. Petrous, cavernous, and supraclinoid segments of the right ICA also widely patent without flow-limiting stenosis. Distal cervical left ICA it is diminutive and attenuated as compared to right. Petrous and cavernous left  ICA attenuated but patent without flow-limiting stenosis. There is moderate to severe narrowing at the supraclinoid left ICA/left ICA terminus (series 5, image 77). A1 segments patent. Anterior communicating artery normal. Anterior cerebral arteries patent to their  distal aspects. M1 segments patent without stenosis or occlusion. MCA bifurcations normal. Distal MCA branches well opacified and symmetric. POSTERIOR CIRCULATION: Vertebral arteries patent to the vertebrobasilar junction. Left vertebral artery dominant. Posterior inferior cerebral arteries patent proximally. Basilar artery widely patent to its distal aspect. Superior cerebral arteries patent proximally. Right PCA arises from the basilar artery and is well opacified to its distal aspect. There is a fetal type left PCA supplied via a left posterior communicating artery. The left PCA is attenuated as compared to the right due to the attenuated left ICA. No aneurysm or vascular malformation. IMPRESSION: MRI HEAD IMPRESSION: 1. No acute intracranial process identified. 2. Remote left MCA territory infarct, with additional small remote right cerebellar infarcts and remote lacunar infarcts involving the bilateral basal ganglia. 3. Generalized age-related cerebral atrophy with mild chronic small vessel ischemic disease. MRA HEAD IMPRESSION: 1. Attenuated flow within the left ICA as compared to the right. Dedicated imaging of the neck to evaluate for proximal stenosis is recommended. 2. Moderate to severe stenosis of the supraclinoid left ICA/left ICA terminus. 3. Fetal type left PCA. Left PCA is slightly attenuated secondary to the attenuated flow within the left ICA. Electronically Signed   By: Rise Mu M.D.   On: 07/08/2016 05:17   Mr Maxine Glenn Head/brain ZO Cm  Result Date: 07/08/2016 CLINICAL DATA:  Initial evaluation for transient vertigo. EXAM: MRI HEAD WITHOUT CONTRAST MRA HEAD WITHOUT CONTRAST TECHNIQUE: Multiplanar, multiecho pulse sequences of the brain and surrounding structures were obtained without intravenous contrast. Angiographic images of the head were obtained using MRA technique without contrast. COMPARISON:  Prior CT from 07/07/2016. FINDINGS: MRI HEAD FINDINGS Brain: Diffuse prominence of  the CSF containing spaces compatible generalized cerebral atrophy. Patchy T2/FLAIR hyperintensity within the periventricular and deep white matter both cerebral hemispheres most consistent with chronic small vessel ischemic disease, mild for age. Chronic microvascular ischemic changes present within the pons is well. Encephalomalacia within the left parietal lobe compatible with remote left MCA territory infarct. Scatter remote lacunar infarcts present within the bilateral basal ganglia as well. Small remote right cerebellar infarcts present. No abnormal foci of restricted diffusion to suggest acute or subacute ischemia. Gray-white matter differentiation maintained. No evidence for acute or chronic intracranial hemorrhage. No mass lesion, midline shift or mass effect. Ventricular prominence related to global parenchymal volume loss of hydrocephalus. No extra-axial fluid collection. Major dural sinuses are grossly patent. Pituitary gland within normal limits. Vascular: Major intracranial vascular flow voids are maintained. Skull and upper cervical spine: Craniocervical junction normal. Visualized upper cervical spine within normal limits. Bone marrow signal intensity normal. No scalp soft tissue abnormality. Sinuses/Orbits: Globes and orbital soft tissues within normal limits. Mild scattered mucosal thickening within the ethmoidal air cells. Paranasal sinuses are otherwise clear. Smaller right mastoid effusion noted. Inner ear structures grossly normal. MRA HEAD FINDINGS ANTERIOR CIRCULATION: Distal cervical right ICA is widely patent. Petrous, cavernous, and supraclinoid segments of the right ICA also widely patent without flow-limiting stenosis. Distal cervical left ICA it is diminutive and attenuated as compared to right. Petrous and cavernous left ICA attenuated but patent without flow-limiting stenosis. There is moderate to severe narrowing at the supraclinoid left ICA/left ICA terminus (series 5, image 77). A1  segments patent. Anterior communicating artery normal. Anterior cerebral  arteries patent to their distal aspects. M1 segments patent without stenosis or occlusion. MCA bifurcations normal. Distal MCA branches well opacified and symmetric. POSTERIOR CIRCULATION: Vertebral arteries patent to the vertebrobasilar junction. Left vertebral artery dominant. Posterior inferior cerebral arteries patent proximally. Basilar artery widely patent to its distal aspect. Superior cerebral arteries patent proximally. Right PCA arises from the basilar artery and is well opacified to its distal aspect. There is a fetal type left PCA supplied via a left posterior communicating artery. The left PCA is attenuated as compared to the right due to the attenuated left ICA. No aneurysm or vascular malformation. IMPRESSION: MRI HEAD IMPRESSION: 1. No acute intracranial process identified. 2. Remote left MCA territory infarct, with additional small remote right cerebellar infarcts and remote lacunar infarcts involving the bilateral basal ganglia. 3. Generalized age-related cerebral atrophy with mild chronic small vessel ischemic disease. MRA HEAD IMPRESSION: 1. Attenuated flow within the left ICA as compared to the right. Dedicated imaging of the neck to evaluate for proximal stenosis is recommended. 2. Moderate to severe stenosis of the supraclinoid left ICA/left ICA terminus. 3. Fetal type left PCA. Left PCA is slightly attenuated secondary to the attenuated flow within the left ICA. Electronically Signed   By: Rise MuBenjamin  McClintock M.D.   On: 07/08/2016 05:17       PHYSICAL EXAM Pleasant elderly   lady currently not in distress. . Afebrile. Head is nontraumatic. Neck is supple without bruit.    Cardiac exam no murmur or gallop. Lungs are clear to auscultation. Distal pulses are well felt. Neurological Exam ;  Awake  Alert oriented x 3. Normal speech and language.eye movements full without nystagmus.fundi were not visualized. Vision  acuity and fields appear normal. Hearing is normal. Palatal movements are normal. Face symmetric. Tongue midline. Normal strength, tone, reflexes and coordination exceptmild right grip and hand weakness.. Normal sensation. Gait deferred.  ASSESSMENT/PLAN Ms. Michel BickersRuth Binsfeld is a [age] y.o. female with history of dehyderation and recent UTI [facility]. She did not receive IV t-PA.   Recurrent episodes of vertigo/nausea  Unclear posterior circulation TIAs versus peripheral vestibular dysfunction  MRI  No acute stroke. 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  MRA  R>L ICA flow. Mod to severe L ICA supraclinoid/terminus flow. Fetal L PCA. L PCA attenuated d/t decreased L ICA flow  Carotid Doppler  pending  2D Echo  pending  LDL 98, goal < 100 for NON stroke pt  HgbA1c pending  Lovenox 40 mg sq daily for VTE prophylaxis  Diet Heart Room service appropriate? Yes; Fluid consistency: Thin  No antithrombotic prior to admission, now on aspirin 81 mg daily  Patient counseled to be compliant with her antithrombotic medications  Ongoing aggressive stroke risk factor management  Therapy recommendations:  No PT  (widowed, lives w/ dtr)  Disposition:  pending   Hypertension  Stable  BP goal normotensive  Other Stroke Risk Factors  Advanced age  Hospital day # 0  I have personally examined this patient, reviewed notes, independently viewed imaging studies, participated in medical decision making and plan of care.ROS completed by me personally and pertinent positives fully documented  I have made any additions or clarifications directly to the above note. She has had multiple recurrent episodes of transient vertigo and nausea and setting of dehydration fluid is unclear whether these represent postresuscitation TIAs due to small vessel disease or peripheral vascular dysfunction. MRA of the brain shows possible high-grade terminal left ICA stenosis hence  we will follow  this up with CT angiogram of the brain. Continue aspirin for stroke prevention. Long discussion of the bedside with the patient and daughter and answered questions. Greater than 50% of time during this 35 minute visit was spent on counseling and coordination of care.stroke and TIA risk, prevention and treatment  Delia Heady, MD Medical Director [facility] Pager: [phone] 07/08/2016 3:36 PM   To contact Stroke Continuity provider, please refer to [email or web address]. After hours, contact General Neurology

## 2016-07-08 NOTE — Progress Notes (Signed)
OT Cancellation Note and Discharge  Patient Details Name: Sheila BickersRuth Byrd MRN: [id number] DOB: 09/02/1926   Cancelled Treatment:    Reason Eval/Treat Not Completed: OT screened, no needs identified, will sign off. Spoke to PT who evaluated pt and she reports pt is at baseline, no need to eval pt.   Evette GeorgesLeonard, [place] Scheidegger [address] [phone] 07/08/2016, 1:02 PM

## 2016-07-09 DIAGNOSIS — I6522 Occlusion and stenosis of left carotid artery: Secondary | ICD-10-CM

## 2016-07-09 LAB — HEMOGLOBIN A1C
Hgb A1c MFr Bld: 5.5 % (ref 4.8–5.6)
MEAN PLASMA GLUCOSE: 111 mg/dL

## 2016-07-09 NOTE — Progress Notes (Signed)
Pt daughter is verbally abusive to staff. Pt daughter is anxious and agitated. Pt noted to be anxious and agitated due to daughter's behavior.

## 2016-07-09 NOTE — Progress Notes (Signed)
STROKE TEAM PROGRESS NOTE   SUBJECTIVE (INTERVAL HISTORY) Daughter at bedside. DR. Pearlean Brownie addressed presence of blockage in ICA with patient and daughter.    OBJECTIVE Temp:  [97.7 F (36.5 C)-98.3 F (36.8 C)] 98.2 F (36.8 C) (11/15 0802) Pulse Rate:  [67-76] 68 (11/15 0802) Cardiac Rhythm: Normal sinus rhythm (11/14 1900) Resp:  [16-19] 16 (11/15 0802) BP: (127-168)/(52-76) 146/52 (11/15 0802) SpO2:  [92 %-97 %] 97 % (11/15 0802)  CBC:   Recent Labs Lab 07/07/16 1027 07/08/16 0529  WBC 4.6 3.8*  NEUTROABS 2.3  --   HGB 12.1 11.1*  HCT 37.6 34.1*  MCV 95.2 93.2  PLT 197 178    Basic Metabolic Panel:   Recent Labs Lab 07/07/16 1027 07/08/16 0529  NA 135 138  K 4.1 3.6  CL 100* 105  CO2 26 27  GLUCOSE 142* 93  BUN 15 9  CREATININE 0.86 0.89  CALCIUM 8.8* 8.5*    Lipid Panel:     Component Value Date/Time   CHOL 172 07/08/2016 0529   TRIG 65 07/08/2016 0529   HDL 61 07/08/2016 0529   CHOLHDL 2.8 07/08/2016 0529   VLDL 13 07/08/2016 0529   LDLCALC 98 07/08/2016 0529   HgbA1c:  Lab Results  Component Value Date   HGBA1C 5.5 07/08/2016   Urine Drug Screen: No results found for: LABOPIA, COCAINSCRNUR, LABBENZ, AMPHETMU, THCU, LABBARB    IMAGING  Ct Angio Head W Or Wo Contrast  Result Date: 07/08/2016 CLINICAL DATA:  Recurrent episodes of vertigo and nausea. Unclear symptom origin, possible posterior circulation TIA. EXAM: CT ANGIOGRAPHY HEAD TECHNIQUE: Multidetector CT imaging of the head was performed using the standard protocol during bolus administration of intravenous contrast. Multiplanar CT image reconstructions and MIPs were obtained to evaluate the vascular anatomy. CONTRAST:  Isovue 370, 50 mL. COMPARISON:  MR brain earlier today. FINDINGS: CT HEAD Calvarium and skull base: No fracture or destructive lesion. Mastoids and middle ears are clear. Paranasal sinuses: Imaged portions are clear. Orbits: No acute findings.  Dense lenticular opacities.  Brain: No acute stroke or hemorrhage. No mass lesion. 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 Central atrophy with hydrocephalus ex vacuo. Scattered areas of chronic lacunar infarction, including both caudate nuclei. Chronic microvascular ischemic change with hypoattenuation of the white matter. CTA HEAD Anterior circulation: The RIGHT internal carotid artery is widely patent. The LEFT internal carotid artery demonstrates a stenosis just proximal to the ICA terminus, 75% or greater. See image 95 series 14. In addition however the skull base LEFT internal carotid artery is smaller than the RIGHT. High-grade LEFT carotid bifurcation stenosis not excluded. CTA neck recommended. No M1 or A1 disease of significance. No  aneurysm, or vascular malformation. Posterior circulation: LEFT vertebral dominant. No significant stenosis, proximal occlusion, aneurysm, or vascular malformation. Venous sinuses: As permitted by contrast timing, patent. Anatomic variants: Fetal LEFT PCA. Delayed phase:   No abnormal intracranial enhancement. Review of the MIP images confirms the above findings. IMPRESSION: High-grade stenosis of the supraclinoid LEFT ICA, 75% or greater, is confirmed from intracranial MRA. Small caliber of the cervical and petrous LEFT ICA segments as compared to the RIGHT. High-grade LEFT carotid bifurcation stenosis not excluded. CTA neck recommended. Atrophy and small vessel disease. No acute intracranial findings. Remote areas of infarction as previously described, stable. Electronically Signed   By: Elsie Stain M.D.   On: 07/08/2016 16:12   Dg Chest 2 View  Result Date: 07/07/2016 CLINICAL DATA:  CVA EXAM:  CHEST  2 VIEW COMPARISON:  06/07/2016 chest radiograph. FINDINGS: Stable cardiomediastinal silhouette with normal heart size and aortic atherosclerosis. No pneumothorax. No pleural effusion. Lungs appear clear, with no acute consolidative airspace disease and no  pulmonary edema. IMPRESSION: No active cardiopulmonary disease. Aortic atherosclerosis. Electronically Signed   By: Delbert Phenix M.D.   On: 07/07/2016 18:42   Ct Head Wo Contrast  Result Date: 07/07/2016 CLINICAL DATA:  82 year old female with dizziness. Initial encounter. EXAM: CT HEAD WITHOUT CONTRAST TECHNIQUE: Contiguous axial images were obtained from the base of the skull through the vertex without intravenous contrast. COMPARISON:  06/07/2016 and 11/12/2012 head CT. 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 Chronic microvascular changes. No intracranial hemorrhage or CT evidence of large acute infarct. No intracranial mass lesion noted on this unenhanced exam. Vascular: Vascular calcifications. Skull: No acute abnormality. Sinuses/Orbits: Visualized orbits unremarkable. Visualized paranasal sinuses and mastoid air cells are clear. Other: Negative IMPRESSION: No intracranial hemorrhage or CT evidence of large acute infarct. 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 Chronic microvascular changes. Global atrophy without hydrocephalus. Electronically Signed   By: Lacy Duverney M.D.   On: 07/07/2016 10:58   Ct Angio Neck W Or Wo Contrast  Result Date: 07/08/2016 CLINICAL DATA:  Dizziness not resolved with fluids. LEFT facial droop. EXAM: CT ANGIOGRAPHY NECK TECHNIQUE: Multidetector CT imaging of the neck was performed using the standard protocol during bolus administration of intravenous contrast. Multiplanar CT image reconstructions and MIPs were obtained to evaluate the vascular anatomy. Carotid stenosis measurements (when applicable) are obtained utilizing NASCET criteria, using the distal internal carotid diameter as the denominator. CONTRAST:  50 cc Isovue 370 COMPARISON:  CTA HEAD July 08, 2016 at 1536 hours FINDINGS: AORTIC ARCH: 4.2 cm fusiform ascending aorta aneurysm. Mild calcific atherosclerosis. The origins of the  innominate, left Common carotid artery and subclavian artery are widely patent. RIGHT CAROTID SYSTEM: Common carotid artery is widely patent, coursing in a straight line fashion. Normal appearance of the carotid bifurcation without hemodynamically significant stenosis by NASCET criteria. Mild calcific atherosclerosis. Normal appearance of the included internal carotid artery. LEFT CAROTID SYSTEM: Common carotid artery is widely patent, coursing in a straight line fashion. Sub cm segment of critical stenosis LEFT internal carotid artery origin, difficult to quantify due to heavily calcified plaque. Patent internal carotid artery. VERTEBRAL ARTERIES:Left vertebral artery is dominant. Normal appearance of the vertebral arteries, which appear widely patent. Mild calcific atherosclerosis. SKELETON: No acute osseous process though bone windows have not been submitted. Poor dentition. Grade 1 C4-5 anterolisthesis. Severe C5-6 and C6-7 degenerative disc. Multilevel severe neural foraminal narrowing. OTHER NECK: Soft tissues of the neck are nonacute though, not tailored for evaluation. IMPRESSION: Critical stenosis LEFT internal carotid artery origin due to calcified plaque. 4.2 cm ascending aortic aneurysm. Recommend annual imaging followup by CTA or MRA. This recommendation follows 2010 ACCF/AHA/AATS/ACR/ASA/SCA/SCAI/SIR/STS/SVM Guidelines for the Diagnosis and Management of Patients with Thoracic Aortic Disease. Circulation. 2010; 121: [id number] Electronically Signed   By: Awilda Metro M.D.   On: 07/08/2016 19:10   Mr Brain Wo Contrast  Result Date: 07/08/2016 CLINICAL DATA:  Initial evaluation for transient vertigo. EXAM: MRI HEAD WITHOUT CONTRAST MRA HEAD WITHOUT CONTRAST TECHNIQUE: Multiplanar, multiecho pulse sequences of the brain and surrounding structures were obtained without intravenous contrast. Angiographic images of the head were obtained using MRA technique without contrast. COMPARISON:  Prior CT  from 07/07/2016. FINDINGS: MRI HEAD FINDINGS Brain: Diffuse prominence of the  CSF containing spaces compatible generalized cerebral atrophy. Patchy T2/FLAIR hyperintensity within the periventricular and deep white matter both cerebral hemispheres most consistent with chronic small vessel ischemic disease, mild for age. Chronic microvascular ischemic changes present within the pons is well. Encephalomalacia within the left parietal lobe compatible with remote left MCA territory infarct. Scatter remote lacunar infarcts present within the bilateral basal ganglia as well. Small remote right cerebellar infarcts present. No abnormal foci of restricted diffusion to suggest acute or subacute ischemia. Gray-white matter differentiation maintained. No evidence for acute or chronic intracranial hemorrhage. No mass lesion, midline shift or mass effect. Ventricular prominence related to global parenchymal volume loss of hydrocephalus. No extra-axial fluid collection. Major dural sinuses are grossly patent. Pituitary gland within normal limits. Vascular: Major intracranial vascular flow voids are maintained. Skull and upper cervical spine: Craniocervical junction normal. Visualized upper cervical spine within normal limits. Bone marrow signal intensity normal. No scalp soft tissue abnormality. Sinuses/Orbits: Globes and orbital soft tissues within normal limits. Mild scattered mucosal thickening within the ethmoidal air cells. Paranasal sinuses are otherwise clear. Smaller right mastoid effusion noted. Inner ear structures grossly normal. MRA HEAD FINDINGS ANTERIOR CIRCULATION: Distal cervical right ICA is widely patent. Petrous, cavernous, and supraclinoid segments of the right ICA also widely patent without flow-limiting stenosis. Distal cervical left ICA it is diminutive and attenuated as compared to right. Petrous and cavernous left ICA attenuated but patent without flow-limiting stenosis. There is moderate to severe  narrowing at the supraclinoid left ICA/left ICA terminus (series 5, image 77). A1 segments patent. Anterior communicating artery normal. Anterior cerebral arteries patent to their distal aspects. M1 segments patent without stenosis or occlusion. MCA bifurcations normal. Distal MCA branches well opacified and symmetric. POSTERIOR CIRCULATION: Vertebral arteries patent to the vertebrobasilar junction. Left vertebral artery dominant. Posterior inferior cerebral arteries patent proximally. Basilar artery widely patent to its distal aspect. Superior cerebral arteries patent proximally. Right PCA arises from the basilar artery and is well opacified to its distal aspect. There is a fetal type left PCA supplied via a left posterior communicating artery. The left PCA is attenuated as compared to the right due to the attenuated left ICA. No aneurysm or vascular malformation. IMPRESSION: MRI HEAD IMPRESSION: 1. No acute intracranial process identified. 2. Remote left MCA territory infarct, with additional small remote right cerebellar infarcts and remote lacunar infarcts involving the bilateral basal ganglia. 3. Generalized age-related cerebral atrophy with mild chronic small vessel ischemic disease. MRA HEAD IMPRESSION: 1. Attenuated flow within the left ICA as compared to the right. Dedicated imaging of the neck to evaluate for proximal stenosis is recommended. 2. Moderate to severe stenosis of the supraclinoid left ICA/left ICA terminus. 3. Fetal type left PCA. Left PCA is slightly attenuated secondary to the attenuated flow within the left ICA. Electronically Signed   By: Rise MuBenjamin  McClintock M.D.   On: 07/08/2016 05:17   Mr Maxine GlennMra Head/brain ZOWo Cm  Result Date: 07/08/2016 CLINICAL DATA:  Initial evaluation for transient vertigo. EXAM: MRI HEAD WITHOUT CONTRAST MRA HEAD WITHOUT CONTRAST TECHNIQUE: Multiplanar, multiecho pulse sequences of the brain and surrounding structures were obtained without intravenous contrast.  Angiographic images of the head were obtained using MRA technique without contrast. COMPARISON:  Prior CT from 07/07/2016. FINDINGS: MRI HEAD FINDINGS Brain: Diffuse prominence of the CSF containing spaces compatible generalized cerebral atrophy. Patchy T2/FLAIR hyperintensity within the periventricular and deep white matter both cerebral hemispheres most consistent with chronic small vessel ischemic disease, mild for age. Chronic microvascular ischemic  changes present within the pons is well. Encephalomalacia within the left parietal lobe compatible with remote left MCA territory infarct. Scatter remote lacunar infarcts present within the bilateral basal ganglia as well. Small remote right cerebellar infarcts present. No abnormal foci of restricted diffusion to suggest acute or subacute ischemia. Gray-white matter differentiation maintained. No evidence for acute or chronic intracranial hemorrhage. No mass lesion, midline shift or mass effect. Ventricular prominence related to global parenchymal volume loss of hydrocephalus. No extra-axial fluid collection. Major dural sinuses are grossly patent. Pituitary gland within normal limits. Vascular: Major intracranial vascular flow voids are maintained. Skull and upper cervical spine: Craniocervical junction normal. Visualized upper cervical spine within normal limits. Bone marrow signal intensity normal. No scalp soft tissue abnormality. Sinuses/Orbits: Globes and orbital soft tissues within normal limits. Mild scattered mucosal thickening within the ethmoidal air cells. Paranasal sinuses are otherwise clear. Smaller right mastoid effusion noted. Inner ear structures grossly normal. MRA HEAD FINDINGS ANTERIOR CIRCULATION: Distal cervical right ICA is widely patent. Petrous, cavernous, and supraclinoid segments of the right ICA also widely patent without flow-limiting stenosis. Distal cervical left ICA it is diminutive and attenuated as compared to right. Petrous and  cavernous left ICA attenuated but patent without flow-limiting stenosis. There is moderate to severe narrowing at the supraclinoid left ICA/left ICA terminus (series 5, image 77). A1 segments patent. Anterior communicating artery normal. Anterior cerebral arteries patent to their distal aspects. M1 segments patent without stenosis or occlusion. MCA bifurcations normal. Distal MCA branches well opacified and symmetric. POSTERIOR CIRCULATION: Vertebral arteries patent to the vertebrobasilar junction. Left vertebral artery dominant. Posterior inferior cerebral arteries patent proximally. Basilar artery widely patent to its distal aspect. Superior cerebral arteries patent proximally. Right PCA arises from the basilar artery and is well opacified to its distal aspect. There is a fetal type left PCA supplied via a left posterior communicating artery. The left PCA is attenuated as compared to the right due to the attenuated left ICA. No aneurysm or vascular malformation. IMPRESSION: MRI HEAD IMPRESSION: 1. No acute intracranial process identified. 2. Remote left MCA territory infarct, with additional small remote right cerebellar infarcts and remote lacunar infarcts involving the bilateral basal ganglia. 3. Generalized age-related cerebral atrophy with mild chronic small vessel ischemic disease. MRA HEAD IMPRESSION: 1. Attenuated flow within the left ICA as compared to the right. Dedicated imaging of the neck to evaluate for proximal stenosis is recommended. 2. Moderate to severe stenosis of the supraclinoid left ICA/left ICA terminus. 3. Fetal type left PCA. Left PCA is slightly attenuated secondary to the attenuated flow within the left ICA. Electronically Signed   By: Rise MuBenjamin  McClintock M.D.   On: 07/08/2016 05:17   Carotid Doppler    1-39 percent stenosis involving the right internal carotid artery.   greater than 80 percent stenosis involving the left internal carotid artery.   Right vertebral artery in  patent and antegrade, left vertebral was difficult to evaluate- likely antegrade.  2D Echocardiogram  - Left ventricle: The cavity size was normal. There was mild focal basal hypertrophy of the septum. Systolic function was normal. The estimated ejection fraction was in the range of 55% to 60%. Wall motion was normal; there were no regional wall motion abnormalities. Doppler parameters are consistent with abnormal left ventricular relaxation (grade 1 diastolic dysfunction). Doppler parameters are consistent with high ventricular filling pressure. - Aortic valve: There was mild regurgitation. - Pulmonary arteries: PA peak pressure: 31 mm Hg (S). Impressions:   Normal LV systolic  function; grade 1 diastolic dysfunction; sclerotic aortic valve with mild AI; mild TR.   PHYSICAL EXAM Pleasant elderly   lady currently not in distress. . Afebrile. Head is nontraumatic. Neck is supple without bruit.    Cardiac exam no murmur or gallop. Lungs are clear to auscultation. Distal pulses are well felt. Neurological Exam ;  Awake  Alert oriented x 3. Normal speech and language.eye movements full without nystagmus.fundi were not visualized. Vision acuity and fields appear normal. Hearing is normal. Palatal movements are normal. Face symmetric. Tongue midline. Normal strength, tone, reflexes and coordination exceptmild right grip and hand weakness.. Normal sensation. Gait deferred.   ASSESSMENT/PLAN Ms. Sheila Byrd is a 82 y.o. female with history of dehyderation and recent UTI presenting to [facility] with dizziness. She did not receive IV t-PA.   Recurrent episodes of vertigo/nausea  Unclear posterior circulation TIAs versus peripheral vestibular dysfunction  MRI  No acute stroke. 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  MRA  R>L ICA flow. Mod to severe L ICA supraclinoid/terminus flow. Fetal L PCA. L PCA attenuated d/t decreased L ICA flow  CTA head and neck critical L ICA, 4.2 cm ascending aortic  aneurysm  Carotid Doppler  L ICA > 80% stenosis  2D Echo  EF 55-60%. No source of embolus   LDL 98, goal < 100 for NON stroke pt  HgbA1c 5.5  Lovenox 40 mg sq daily for VTE prophylaxis Diet Heart Room service appropriate? Yes; Fluid consistency: Thin  No antithrombotic prior to admission, now on aspirin 81 mg daily  Patient counseled to be compliant with her antithrombotic medications  Ongoing aggressive stroke risk factor management  Therapy recommendations:  No PT, no OT  (widowed, lives w/ dtr)  Disposition:  Return home  VVS consult recommended as IP  If surgery indicated, ok to do as an OP   Follow up with DR. Jodeen Mclin, stroke clinic, in 6 weeks order written  Hypertension  Stable BP goal normotensive  Other Stroke Risk Factors  Advanced age  Hospital day # 1  [facility] See Amion for Pager information 07/09/2016 10:22 AM  I have personally examined this patient, reviewed notes, independently viewed imaging studies, participated in medical decision making and plan of care.ROS completed by me personally and pertinent positives fully documented  I have made any additions or clarifications directly to the above note. Agree with note above. I reviewed with patient and daughter the risk-benefit ratio for carotid revascularization versus stroke prevention for high-grade left ICA stenosis. Recommend vascular surgery consult and elective left carotid surgery in a few weeks as an outpatient. Family is in agreement. Greater than 50% of time during this 25 minute visit was spent on counseling and coordination of care about stroke and TIA risk, prevention and treatment Stroke team will sign off. Follow-up as an outpatient in stroke clinic.  Delia Heady, MD Medical Director [facility] Pager: [phone] 07/09/2016 1:46 PM  To contact Stroke Continuity provider, please refer to [email or web address]. After hours, contact General Neurology

## 2016-07-09 NOTE — Progress Notes (Signed)
RN in to toilet pt. Pt daughter, Annice PihJackie, very upset this morning continuing to talk of receiving news that Medicare would not be paying for all of hospital stay, stating "after 65 you should just die because otherwise the hospital will try to or you will be paying for everything even though you've paid enough through insurance already. We should have just left from the ER if this is all they are going to do." Daughter became tearful several times including when discussing her [facility]. Pt did not speak on this discussion with daughter.  RN attempted to give emotional support although felt it was not well received. Continue to monitor.

## 2016-07-09 NOTE — Consult Note (Signed)
[facility] [place]  Patient name: Sheila BickersRuth Sohail MRN: [id number] DOB: 04-06-1927 Sex: female  REASON FOR CONSULT: left carotid stenosis, consult is from Dr. Benjamine MolaVann  HPI: Sheila BickersRuth Nichelson is a 82 y.o. female, who presented to [facility] 2 days ago after an episode of dizziness while laying down. This has resolved. The patient is accompanied by her daughter Annice PihJackie who provides most of the history. The patient has had two prior episodes in the past that was felt to be due to dehydration. In 2014, the patient had an episode of dizziness followed by "falling out" while sitting at her kitchen table. According to her daughter, the patient consciousness and she was taken to the hospital where it was felt that her symptoms were secondary to dehydration and "electrolytes being off." She had a CT head done that was negative for acute bleed. After this episode, the daughter started noticing progressive weakness of her right arm. She has never been formally worked up for stroke. She continues to have mild right upper extremity weakness. The ED provider was concerned about her history of right arm weakness and left facial droop on exam and [facility].   The patient denies any amaurosis fugax, slurred speech, expressive aphasia or new lateralizing weakness. She does not go to the doctor regularly. She does not have hypertension, diabetes or hyperlipidemia. She has never been a smoker. She has not been on aspirin until this admission.   She lives at home with her daughter. She is active and is able to accomplish activities of daily living. She ambulates without assistance. She denies any history of chest pain or shortness of breath. She denies any history of irregular heart rhythm. She reports being told she has a murmur.   Past Medical History:  Diagnosis Date  . Cellulitis   . Dehydration     History reviewed. No pertinent family history.  SOCIAL  HISTORY: Social History   Social History  . Marital status: Married    Spouse name: N/A  . Number of children: N/A  . Years of education: N/A   Occupational History  . Not on file.   Social History Main Topics  . Smoking status: Never Smoker  . Smokeless tobacco: Never Used  . Alcohol use No  . Drug use: No  . Sexual activity: Not on file   Other Topics Concern  . Not on file   Social History Narrative  . No narrative on file    No Known Allergies  Current Facility-Administered Medications  Medication Dose Route Frequency Provider Last Rate Last Dose  . 0.9 %  sodium chloride infusion   Intravenous Continuous Joseph ArtJessica U Vann, DO 50 mL/hr at 07/08/16 2226    . aspirin EC tablet 81 mg  81 mg Oral Daily Jessica U Vann, DO   81 mg at 07/09/16 1000  . enoxaparin (LOVENOX) injection 40 mg  40 mg Subcutaneous Q24H Joseph ArtJessica U Vann, DO   40 mg at 07/08/16 2217  . senna-docusate (Senokot-S) tablet 1 tablet  1 tablet Oral QHS PRN Joseph ArtJessica U Vann, DO        REVIEW OF SYSTEMS:  [X]  denotes positive finding, [ ]  denotes negative finding Cardiac  Comments:  Chest pain or chest pressure:    Shortness of breath upon exertion:    Short of breath when lying flat:    Irregular heart rhythm:        Vascular  Pain in calf, thigh, or hip brought on by ambulation:    Pain in feet at night that wakes you up from your sleep:     Blood clot in your veins:    Leg swelling:         Pulmonary    Oxygen at home:    Productive cough:     Wheezing:         Neurologic    Sudden weakness in arms or legs:     Sudden numbness in arms or legs:     Sudden onset of difficulty speaking or slurred speech:    Temporary loss of vision in one eye:     Problems with dizziness:         Gastrointestinal    Blood in stool:     Vomited blood:         Genitourinary    Burning when urinating:     Blood in urine:        Psychiatric    Major depression:         Hematologic    Bleeding problems:     Problems with blood clotting too easily:        Skin    Rashes or ulcers:        Constitutional    Fever or chills:      PHYSICAL EXAM: Vitals:   07/09/16 0154 07/09/16 0551 07/09/16 0802 07/09/16 1006  BP: (!) 127/53 135/63 (!) 146/52 (!) 173/67  Pulse: 67 67 68 72  Resp: 19 18 16 18   Temp: 97.7 F (36.5 C) 98.2 F (36.8 C) 98.2 F (36.8 C) 97.4 F (36.3 C)  TempSrc: Oral Oral Oral Oral  SpO2: 95% 96% 97% 96%  Weight:      Height:        GENERAL: The patient is a well-nourished female, in no acute distress. The vital signs are documented above. CARDIAC: There is a regular rate and rhythm. No carotid bruits. VASCULAR: 2+ radial pulses bilaterally equal and symmetric. Palpable pedal pulses. PULMONARY: There is good air exchange bilaterally without wheezing or rales. ABDOMEN: Soft and non-tender. No pulsatile mass.  MUSCULOSKELETAL: There are no major deformities or cyanosis. NEUROLOGIC: Mild right upper extremity weakness. Normal strength remaining extremities.  SKIN: There are no ulcers or rashes noted. PSYCHIATRIC: The patient has a normal affect.  DATA:  Carotid duplex 07/08/16  Left ICA: >80%, velocities 519/132 cm/s  Right ICA: <40% stenosis Right vertebral patent with antegrade flow, left vertebral difficult to evaluate  CTA Head/Neck 07/08/16  FINDINGS: AORTIC ARCH: 4.2 cm fusiform ascending aorta aneurysm. Mild calcific atherosclerosis. The origins of the innominate, left Common carotid artery and subclavian artery are widely patent.  RIGHT CAROTID SYSTEM: Common carotid artery is widely patent, coursing in a straight line fashion. Normal appearance of the carotid bifurcation without hemodynamically significant stenosis by NASCET criteria. Mild calcific atherosclerosis. Normal appearance of the included internal carotid artery.  LEFT CAROTID SYSTEM: Common carotid artery is widely patent, coursing in a straight line fashion. Sub cm segment of  critical stenosis LEFT internal carotid artery origin, difficult to quantify due to heavily calcified plaque. Patent internal carotid artery.  VERTEBRAL ARTERIES:Left vertebral artery is dominant. Normal appearance of the vertebral arteries, which appear widely patent. Mild calcific atherosclerosis.  SKELETON: No acute osseous process though bone windows have not been submitted. Poor dentition. Grade 1 C4-5 anterolisthesis. Severe C5-6 and C6-7 degenerative disc. Multilevel severe neural foraminal narrowing.  OTHER NECK: Soft  tissues of the neck are nonacute though, not tailored for evaluation.  IMPRESSION: Critical stenosis LEFT internal carotid artery origin due to calcified plaque.  4.2 cm ascending aortic aneurysm. Recommend annual imaging followup by CTA or MRA.   MR Brain 07/08/16  MRI HEAD IMPRESSION:  1. No acute intracranial process identified. 2. Remote left MCA territory infarct, with additional small remote right cerebellar infarcts and remote lacunar infarcts involving the bilateral basal ganglia. 3. Generalized age-related cerebral atrophy with mild chronic small vessel ischemic disease.  MRA HEAD IMPRESSION:  1. Attenuated flow within the left ICA as compared to the right. Dedicated imaging of the neck to evaluate for proximal stenosis is recommended. 2. Moderate to severe stenosis of the supraclinoid left ICA/left ICA terminus. 3. Fetal type left PCA. Left PCA is slightly attenuated secondary to the attenuated flow within the left ICA.  MEDICAL ISSUES: High grade asymptomatic left internal carotid artery stenosis Dizziness/recurrent vertigo  The patient's dizziness has resolved. On workup for TIA, she was found to have a high grade left ICA stenosis. She had a remote left MCA territory infarct and small remote right cerebellar and lacunar infarcts on MRI. She has never been formally worked up for CVA in the past. Following an episode of  dizziness back in 2014, she developed some right arm weakness. This is likely secondary to a CVA. Her current workup this admission is negative for any acute stroke.   Recommend left carotid endarterectomy for stroke prevention. This can be arranged as an outpatient. Dr. Myra Gianotti to see patient this afternoon.    Maris Berger, PA-C [facility] [place] [phone]    I agree with the above.  I have seen and examined the patient.  She presented with dizziness. Her workup revealed an asymptomatic > 80% left ICA stenosis on CTA.  We discussed elective Left CEA.  Given her age, we discussed also eleecting to continue with medical management.  I am concerned about her ability to tolerate a left CEA under general anesthesia, Therefore, I have recommended to have her see me in the office in 3 weeks for further discussions.  Durene Cal

## 2016-07-09 NOTE — Progress Notes (Signed)
Pt noted in bed, daughter stated that pt wants to sit up on the side of the bed. Pt unsteady, suggested to daughter that pt sit up in chair. Daughter did not want pt in chair although pt agreed that would help her. Pt was put up in the chair to eat lunch. About 10 min later daughter requested that pt be put back to bed stating that she is not comfortable. Pt put back to bed by tech. Daughter calls this nurse stating that pt is having trouble eating her lunch. Reported to room noted pt sitting up in bed comfortably eating her lunch at a fast pace but not in distress.  Pt encouraged to slow down during her meal and take sips of her water to help the food go down better. Pt agreed and verbalized that it helped.  Daughter interrupts stating, " You all think that we are stupid!" This nurse noted that pt is still eating her lunch without any difficulties or distress. Daughter then starts to yelling at this nurse. This nurse exit pt room.  Pt noted with no distress. Call bell within reach. Safety measures in place.

## 2016-07-09 NOTE — Progress Notes (Signed)
Patient more confused/drowsy at 0200 with vitals and attempt to use BSC. Patient A&O x1 at this time, only oriented to self however no other neuro changes (A&Ox3 previous, off on time). Daughter anxious at bedside, emotional support given. Daughter asking if antibiotic was ordered as an MD had mentioned possibly UTI from last month was not gone; no meds ordered as of now.  Continue to monitor patient.

## 2016-07-09 NOTE — Care Management Note (Signed)
Case Management Note  Patient Details  Name: Sheila BickersRuth Byrd MRN: [id number] Date of Birth: 02/26/1927  Subjective/Objective:                    Action/Plan: Pt discharging home with her daughter. No f/u per PT. PT recommending intermittent supervision which daughter can provide. CM provided the daughter with a list of PCP that are taking new patients. Daughter to research the list and make an appointment for her mother with a PCP. No further needs per CM.   Expected Discharge Date:                  Expected Discharge Plan:  Home/Self Care  In-House Referral:     Discharge planning Services  CM Consult  Post Acute Care Choice:    Choice offered to:     DME Arranged:    DME Agency:     HH Arranged:    HH Agency:     Status of Service:  Completed, signed off  If discussed at [employer] Length of Stay Meetings, dates discussed:    Additional Comments:  Sheila BaloKelli F Dashonda Bonneau, RN 07/09/2016, 3:25 PM

## 2016-07-09 NOTE — Discharge Summary (Addendum)
Physician Discharge Summary  Sheila Byrd ZOX:[id number] DOB: 03/15/1927 DOA: 07/07/2016  PCP: No Pcp Per Pt (Inactive)  Admit date: 07/07/2016 Discharge date: 07/09/2016   Recommendations for Outpatient Follow-Up:   1. Outpatient vascular follow up 2. May need PRN IVF   Discharge Diagnosis:   Active Problems:   Dizziness   Dehydration   Left carotid stenosis   Discharge disposition:  Home.  Discharge Condition: Improved.  Diet recommendation: Low sodium, heart healthy  Wound care: None.   History of Present Illness:   Sheila Byrd is a 82 y.o. female with medical history significant of dehydration and a recent UTI-- does not take medication at home.  Daughter states she has episodes of dizziness at home that usually means she is dehydrated.  She normally goes to the ER for fluid and this resolves.  Today, the ER doc stated that it appeared she had a facial droop.  Daughter did not endorse.  Patient does not have teeth on right side.  Right arm has been weak since 2014 when "her electrolytes" were out of order.    Patient normally gets up around the house on her own.  Does not use walker/wheelchair.  Daughter states patient does not drink enough water on a daily basis and gets dehydrated and this presents as dizziness.  Patient received IVF in ER and dizziness has resolved.    Patient was referred to hospital for TIA work up. Neuro consulted in ER   Hospital Course by Problem:   Recurrent episodes of vertigo/nausea  Unclear posterior circulation TIAs versus peripheral vestibular dysfunction  MRI  No acute stroke. 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  MRA  R>L ICA flow. Mod to severe L ICA supraclinoid/terminus flow. Fetal L PCA. L PCA attenuated d/t decreased L ICA flow  CTA head and neck critical L ICA, 4.2 cm ascending aortic aneurysm  Carotid Doppler  L ICA > 80% stenosis  2D Echo  EF 55-60%. No source of embolus   LDL 98, goal < 100 for NON stroke  pt  HgbA1c 5.5  No antithrombotic prior to admission, now on aspirin 81 mg daily  Patient counseled to be compliant with her antithrombotic medications  Ongoing aggressive stroke risk factor management  Therapy recommendations:  No PT, no OT  (widowed, lives w/ dtr)  VVS consult recommended as IP  If surgery indicated, ok to do as an OP   Follow up with DR. Sethi, stroke clinic, in 6 weeks order written   Dehydration -IVF PRN   Medical Consultants:    Neuro  vascular.   Discharge Exam:   Vitals:   07/09/16 1006 07/09/16 1222  BP: (!) 173/67 130/78  Pulse: 72 68  Resp: 18 18  Temp: 97.4 F (36.3 C) 98.2 F (36.8 C)   Vitals:   07/09/16 0551 07/09/16 0802 07/09/16 1006 07/09/16 1222  BP: 135/63 (!) 146/52 (!) 173/67 130/78  Pulse: 67 68 72 68  Resp: 18 16 18 18   Temp: 98.2 F (36.8 C) 98.2 F (36.8 C) 97.4 F (36.3 C) 98.2 F (36.8 C)  TempSrc: Oral Oral Oral Oral  SpO2: 96% 97% 96% 100%  Weight:      Height:        Gen:  NAD    The results of significant diagnostics from this hospitalization (including imaging, microbiology, ancillary and laboratory) are listed below for reference.     Procedures and Diagnostic Studies:   Ct Angio Head W Or Wo  Contrast  Result Date: 07/08/2016 CLINICAL DATA:  Recurrent episodes of vertigo and nausea. Unclear symptom origin, possible posterior circulation TIA. EXAM: CT ANGIOGRAPHY HEAD TECHNIQUE: Multidetector CT imaging of the head was performed using the standard protocol during bolus administration of intravenous contrast. Multiplanar CT image reconstructions and MIPs were obtained to evaluate the vascular anatomy. CONTRAST:  Isovue 370, 50 mL. COMPARISON:  MR brain earlier today. FINDINGS: CT HEAD Calvarium and skull base: No fracture or destructive lesion. Mastoids and middle ears are clear. Paranasal sinuses: Imaged portions are clear. Orbits: No acute findings.  Dense lenticular opacities. Brain: No acute  stroke or hemorrhage. No mass lesion. 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 Central atrophy with hydrocephalus ex vacuo. Scattered areas of chronic lacunar infarction, including both caudate nuclei. Chronic microvascular ischemic change with hypoattenuation of the white matter. CTA HEAD Anterior circulation: The RIGHT internal carotid artery is widely patent. The LEFT internal carotid artery demonstrates a stenosis just proximal to the ICA terminus, 75% or greater. See image 95 series 14. In addition however the skull base LEFT internal carotid artery is smaller than the RIGHT. High-grade LEFT carotid bifurcation stenosis not excluded. CTA neck recommended. No M1 or A1 disease of significance. No  aneurysm, or vascular malformation. Posterior circulation: LEFT vertebral dominant. No significant stenosis, proximal occlusion, aneurysm, or vascular malformation. Venous sinuses: As permitted by contrast timing, patent. Anatomic variants: Fetal LEFT PCA. Delayed phase:   No abnormal intracranial enhancement. Review of the MIP images confirms the above findings. IMPRESSION: High-grade stenosis of the supraclinoid LEFT ICA, 75% or greater, is confirmed from intracranial MRA. Small caliber of the cervical and petrous LEFT ICA segments as compared to the RIGHT. High-grade LEFT carotid bifurcation stenosis not excluded. CTA neck recommended. Atrophy and small vessel disease. No acute intracranial findings. Remote areas of infarction as previously described, stable. Electronically Signed   By: Elsie StainJohn T Curnes M.D.   On: 07/08/2016 16:12   Dg Chest 2 View  Result Date: 07/07/2016 CLINICAL DATA:  CVA EXAM: CHEST  2 VIEW COMPARISON:  06/07/2016 chest radiograph. FINDINGS: Stable cardiomediastinal silhouette with normal heart size and aortic atherosclerosis. No pneumothorax. No pleural effusion. Lungs appear clear, with no acute consolidative airspace disease and no pulmonary edema.  IMPRESSION: No active cardiopulmonary disease. Aortic atherosclerosis. Electronically Signed   By: Delbert PhenixJason A Poff M.D.   On: 07/07/2016 18:42   Ct Head Wo Contrast  Result Date: 07/07/2016 CLINICAL DATA:  82 year old female with dizziness. Initial encounter. EXAM: CT HEAD WITHOUT CONTRAST TECHNIQUE: Contiguous axial images were obtained from the base of the skull through the vertex without intravenous contrast. COMPARISON:  06/07/2016 and 11/12/2012 head CT. 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 Chronic microvascular changes. No intracranial hemorrhage or CT evidence of large acute infarct. No intracranial mass lesion noted on this unenhanced exam. Vascular: Vascular calcifications. Skull: No acute abnormality. Sinuses/Orbits: Visualized orbits unremarkable. Visualized paranasal sinuses and mastoid air cells are clear. Other: Negative IMPRESSION: No intracranial hemorrhage or CT evidence of large acute infarct. 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 Chronic microvascular changes. Global atrophy without hydrocephalus. Electronically Signed   By: Lacy DuverneySteven  Olson M.D.   On: 07/07/2016 10:58   Ct Angio Neck W Or Wo Contrast  Result Date: 07/08/2016 CLINICAL DATA:  Dizziness not resolved with fluids. LEFT facial droop. EXAM: CT ANGIOGRAPHY NECK TECHNIQUE: Multidetector CT imaging of the neck was performed using the standard protocol during bolus  administration of intravenous contrast. Multiplanar CT image reconstructions and MIPs were obtained to evaluate the vascular anatomy. Carotid stenosis measurements (when applicable) are obtained utilizing NASCET criteria, using the distal internal carotid diameter as the denominator. CONTRAST:  50 cc Isovue 370 COMPARISON:  CTA HEAD July 08, 2016 at 1536 hours FINDINGS: AORTIC ARCH: 4.2 cm fusiform ascending aorta aneurysm. Mild calcific atherosclerosis. The origins of the innominate, left  Common carotid artery and subclavian artery are widely patent. RIGHT CAROTID SYSTEM: Common carotid artery is widely patent, coursing in a straight line fashion. Normal appearance of the carotid bifurcation without hemodynamically significant stenosis by NASCET criteria. Mild calcific atherosclerosis. Normal appearance of the included internal carotid artery. LEFT CAROTID SYSTEM: Common carotid artery is widely patent, coursing in a straight line fashion. Sub cm segment of critical stenosis LEFT internal carotid artery origin, difficult to quantify due to heavily calcified plaque. Patent internal carotid artery. VERTEBRAL ARTERIES:Left vertebral artery is dominant. Normal appearance of the vertebral arteries, which appear widely patent. Mild calcific atherosclerosis. SKELETON: No acute osseous process though bone windows have not been submitted. Poor dentition. Grade 1 C4-5 anterolisthesis. Severe C5-6 and C6-7 degenerative disc. Multilevel severe neural foraminal narrowing. OTHER NECK: Soft tissues of the neck are nonacute though, not tailored for evaluation. IMPRESSION: Critical stenosis LEFT internal carotid artery origin due to calcified plaque. 4.2 cm ascending aortic aneurysm. Recommend annual imaging followup by CTA or MRA. This recommendation follows 2010 ACCF/AHA/AATS/ACR/ASA/SCA/SCAI/SIR/STS/SVM Guidelines for the Diagnosis and Management of Patients with Thoracic Aortic Disease. Circulation. 2010; 121: W098-J191 Electronically Signed   By: Awilda Metro M.D.   On: 07/08/2016 19:10   Mr Brain Wo Contrast  Result Date: 07/08/2016 CLINICAL DATA:  Initial evaluation for transient vertigo. EXAM: MRI HEAD WITHOUT CONTRAST MRA HEAD WITHOUT CONTRAST TECHNIQUE: Multiplanar, multiecho pulse sequences of the brain and surrounding structures were obtained without intravenous contrast. Angiographic images of the head were obtained using MRA technique without contrast. COMPARISON:  Prior CT from 07/07/2016.  FINDINGS: MRI HEAD FINDINGS Brain: Diffuse prominence of the CSF containing spaces compatible generalized cerebral atrophy. Patchy T2/FLAIR hyperintensity within the periventricular and deep white matter both cerebral hemispheres most consistent with chronic small vessel ischemic disease, mild for age. Chronic microvascular ischemic changes present within the pons is well. Encephalomalacia within the left parietal lobe compatible with remote left MCA territory infarct. Scatter remote lacunar infarcts present within the bilateral basal ganglia as well. Small remote right cerebellar infarcts present. No abnormal foci of restricted diffusion to suggest acute or subacute ischemia. Gray-white matter differentiation maintained. No evidence for acute or chronic intracranial hemorrhage. No mass lesion, midline shift or mass effect. Ventricular prominence related to global parenchymal volume loss of hydrocephalus. No extra-axial fluid collection. Major dural sinuses are grossly patent. Pituitary gland within normal limits. Vascular: Major intracranial vascular flow voids are maintained. Skull and upper cervical spine: Craniocervical junction normal. Visualized upper cervical spine within normal limits. Bone marrow signal intensity normal. No scalp soft tissue abnormality. Sinuses/Orbits: Globes and orbital soft tissues within normal limits. Mild scattered mucosal thickening within the ethmoidal air cells. Paranasal sinuses are otherwise clear. Smaller right mastoid effusion noted. Inner ear structures grossly normal. MRA HEAD FINDINGS ANTERIOR CIRCULATION: Distal cervical right ICA is widely patent. Petrous, cavernous, and supraclinoid segments of the right ICA also widely patent without flow-limiting stenosis. Distal cervical left ICA it is diminutive and attenuated as compared to right. Petrous and cavernous left ICA attenuated but patent without flow-limiting stenosis. There is moderate to  severe narrowing at the  supraclinoid left ICA/left ICA terminus (series 5, image 77). A1 segments patent. Anterior communicating artery normal. Anterior cerebral arteries patent to their distal aspects. M1 segments patent without stenosis or occlusion. MCA bifurcations normal. Distal MCA branches well opacified and symmetric. POSTERIOR CIRCULATION: Vertebral arteries patent to the vertebrobasilar junction. Left vertebral artery dominant. Posterior inferior cerebral arteries patent proximally. Basilar artery widely patent to its distal aspect. Superior cerebral arteries patent proximally. Right PCA arises from the basilar artery and is well opacified to its distal aspect. There is a fetal type left PCA supplied via a left posterior communicating artery. The left PCA is attenuated as compared to the right due to the attenuated left ICA. No aneurysm or vascular malformation. IMPRESSION: MRI HEAD IMPRESSION: 1. No acute intracranial process identified. 2. Remote left MCA territory infarct, with additional small remote right cerebellar infarcts and remote lacunar infarcts involving the bilateral basal ganglia. 3. Generalized age-related cerebral atrophy with mild chronic small vessel ischemic disease. MRA HEAD IMPRESSION: 1. Attenuated flow within the left ICA as compared to the right. Dedicated imaging of the neck to evaluate for proximal stenosis is recommended. 2. Moderate to severe stenosis of the supraclinoid left ICA/left ICA terminus. 3. Fetal type left PCA. Left PCA is slightly attenuated secondary to the attenuated flow within the left ICA. Electronically Signed   By: Rise Mu M.D.   On: 07/08/2016 05:17   Mr Maxine Glenn Head/brain ZO Cm  Result Date: 07/08/2016 CLINICAL DATA:  Initial evaluation for transient vertigo. EXAM: MRI HEAD WITHOUT CONTRAST MRA HEAD WITHOUT CONTRAST TECHNIQUE: Multiplanar, multiecho pulse sequences of the brain and surrounding structures were obtained without intravenous contrast. Angiographic images  of the head were obtained using MRA technique without contrast. COMPARISON:  Prior CT from 07/07/2016. FINDINGS: MRI HEAD FINDINGS Brain: Diffuse prominence of the CSF containing spaces compatible generalized cerebral atrophy. Patchy T2/FLAIR hyperintensity within the periventricular and deep white matter both cerebral hemispheres most consistent with chronic small vessel ischemic disease, mild for age. Chronic microvascular ischemic changes present within the pons is well. Encephalomalacia within the left parietal lobe compatible with remote left MCA territory infarct. Scatter remote lacunar infarcts present within the bilateral basal ganglia as well. Small remote right cerebellar infarcts present. No abnormal foci of restricted diffusion to suggest acute or subacute ischemia. Gray-white matter differentiation maintained. No evidence for acute or chronic intracranial hemorrhage. No mass lesion, midline shift or mass effect. Ventricular prominence related to global parenchymal volume loss of hydrocephalus. No extra-axial fluid collection. Major dural sinuses are grossly patent. Pituitary gland within normal limits. Vascular: Major intracranial vascular flow voids are maintained. Skull and upper cervical spine: Craniocervical junction normal. Visualized upper cervical spine within normal limits. Bone marrow signal intensity normal. No scalp soft tissue abnormality. Sinuses/Orbits: Globes and orbital soft tissues within normal limits. Mild scattered mucosal thickening within the ethmoidal air cells. Paranasal sinuses are otherwise clear. Smaller right mastoid effusion noted. Inner ear structures grossly normal. MRA HEAD FINDINGS ANTERIOR CIRCULATION: Distal cervical right ICA is widely patent. Petrous, cavernous, and supraclinoid segments of the right ICA also widely patent without flow-limiting stenosis. Distal cervical left ICA it is diminutive and attenuated as compared to right. Petrous and cavernous left ICA  attenuated but patent without flow-limiting stenosis. There is moderate to severe narrowing at the supraclinoid left ICA/left ICA terminus (series 5, image 77). A1 segments patent. Anterior communicating artery normal. Anterior cerebral arteries patent to their distal aspects. M1 segments patent without stenosis  or occlusion. MCA bifurcations normal. Distal MCA branches well opacified and symmetric. POSTERIOR CIRCULATION: Vertebral arteries patent to the vertebrobasilar junction. Left vertebral artery dominant. Posterior inferior cerebral arteries patent proximally. Basilar artery widely patent to its distal aspect. Superior cerebral arteries patent proximally. Right PCA arises from the basilar artery and is well opacified to its distal aspect. There is a fetal type left PCA supplied via a left posterior communicating artery. The left PCA is attenuated as compared to the right due to the attenuated left ICA. No aneurysm or vascular malformation. IMPRESSION: MRI HEAD IMPRESSION: 1. No acute intracranial process identified. 2. Remote left MCA territory infarct, with additional small remote right cerebellar infarcts and remote lacunar infarcts involving the bilateral basal ganglia. 3. Generalized age-related cerebral atrophy with mild chronic small vessel ischemic disease. MRA HEAD IMPRESSION: 1. Attenuated flow within the left ICA as compared to the right. Dedicated imaging of the neck to evaluate for proximal stenosis is recommended. 2. Moderate to severe stenosis of the supraclinoid left ICA/left ICA terminus. 3. Fetal type left PCA. Left PCA is slightly attenuated secondary to the attenuated flow within the left ICA. Electronically Signed   By: Rise MuBenjamin  McClintock M.D.   On: 07/08/2016 05:17     Labs:   Basic Metabolic Panel:  Recent Labs Lab 07/07/16 1027 07/08/16 0529  NA 135 138  K 4.1 3.6  CL 100* 105  CO2 26 27  GLUCOSE 142* 93  BUN 15 9  CREATININE 0.86 0.89  CALCIUM 8.8* 8.5*    GFR Estimated Creatinine Clearance: 32.6 mL/min (by C-G formula based on SCr of 0.89 mg/dL). Liver Function Tests:  Recent Labs Lab 07/07/16 1027 07/08/16 0529  AST 21 20  ALT 12* 10*  ALKPHOS 60 50  BILITOT 0.5 0.4  PROT 7.3 6.0*  ALBUMIN 4.1 3.2*   No results for input(s): LIPASE, AMYLASE in the last 168 hours. No results for input(s): AMMONIA in the last 168 hours. Coagulation profile No results for input(s): INR, PROTIME in the last 168 hours.  CBC:  Recent Labs Lab 07/07/16 1027 07/08/16 0529  WBC 4.6 3.8*  NEUTROABS 2.3  --   HGB 12.1 11.1*  HCT 37.6 34.1*  MCV 95.2 93.2  PLT 197 178   Cardiac Enzymes: No results for input(s): CKTOTAL, CKMB, CKMBINDEX, TROPONINI in the last 168 hours. BNP: Invalid input(s): POCBNP CBG: No results for input(s): GLUCAP in the last 168 hours. D-Dimer No results for input(s): DDIMER in the last 72 hours. Hgb A1c  Recent Labs  07/08/16 0529  HGBA1C 5.5   Lipid Profile  Recent Labs  07/08/16 0529  CHOL 172  HDL 61  LDLCALC 98  TRIG 65  CHOLHDL 2.8   Thyroid function studies No results for input(s): TSH, T4TOTAL, T3FREE, THYROIDAB in the last 72 hours.  Invalid input(s): FREET3 Anemia work up No results for input(s): VITAMINB12, FOLATE, FERRITIN, TIBC, IRON, RETICCTPCT in the last 72 hours. Microbiology No results found for this or any previous visit (from the past 240 hour(s)).   Discharge Instructions:   Discharge Instructions    Ambulatory referral to Neurology    Complete by:  As directed    Stroke patient. Dr. Pearlean BrownieSethi prefers follow up in 6 weeks   Diet - low sodium heart healthy    Complete by:  As directed    Increase activity slowly    Complete by:  As directed        Medication List    STOP taking these  medications   cephALEXin 500 MG capsule Commonly known as:  KEFLEX     TAKE these medications   aspirin 81 MG EC tablet Take 1 tablet (81 mg total) by mouth daily.      Follow-up  Information    SETHI,PRAMOD, MD Follow up in 6 week(s).   Specialties:  Neurology, Radiology Why:  stroke clinic. office will call with appt date and time Contact information: [address] [place] [address] [phone]        No Pcp Per Pt Follow up.   Why:  daughter to call to establish with PCP           Time coordinating discharge: 35 min  Signed:  Jadarious Dobbins U Jamicah Anstead   [facility] 07/09/2016, 2:17 PM

## 2016-07-09 NOTE — Evaluation (Signed)
Speech Language Pathology Evaluation Patient Details Name: Sheila BickersRuth Byrd MRN: [id number] DOB: 24-Jan-1927 Today's Date: 07/09/2016 Time: [id number] SLP Time Calculation (min) (ACUTE ONLY): 25 min  Problem List:  Patient Active Problem List   Diagnosis Date Noted  . Left carotid stenosis 07/09/2016  . Dehydration   . Dizziness 07/07/2016   Past Medical History:  Past Medical History:  Diagnosis Date  . Cellulitis   . Dehydration    Past Surgical History:  Past Surgical History:  Procedure Laterality Date  . TONSILLECTOMY     HPI:  Sheila MassRuth Byrd a 82 y.o.female, who [facility] days ago after an episode of dizziness while laying down. This has resolved. The patient is accompanied by her daughter Sheila Byrd who provides most of the history. The patient has had two prior episodes in the past that was felt to be due to dehydration. In 2014, the patient had an episode of dizziness followed by "falling out" while sitting at her kitchen table. According to her daughter, the patient consciousness and she was taken to the hospital where it was felt that her symptoms were secondary to dehydration and "electrolytes being off." She had a CT head done that was negative for acute bleed. After this episode, the daughter started noticing progressive weakness of her right arm. She has never been formally worked up for stroke. She continues to have mild right upper extremity weakness. The ED provider was concerned about her history of right arm weakness and left facial droop on exam and sent [facility]IA workup. The patient denies any amaurosis fugax, slurred speech, expressive aphasia or new lateralizing weakness. She does not go to the doctor regularly. She does not have hypertension, diabetes or hyperlipidemia. She has never been a smoker. She has not been on aspirin until this admission. She lives at home with her daughter. She is active and is able to accomplish activities of  daily living. She ambulates without assistance. She denies any history of chest pain or shortness of breath. She denies any history of irregular heart rhythm. She reports being told she has a murmur. MRI didn't show any no acute intracranial process. Remote left MCA territory infarct, with additional small remote   Assessment / Plan / Recommendation Clinical Impression  Pt appears to have appropriate cognitive-lignuistic function. Duaghter present     SLP Assessment  Patient does not need any further Speech Lanaguage Pathology Services    Follow Up Recommendations  None          SLP Evaluation Cognition  Overall Cognitive Status: Within Functional Limits for tasks assessed Arousal/Alertness: Awake/alert Orientation Level: Oriented to person;Oriented to place;Disoriented to time;Oriented to situation Attention: Sustained Sustained Attention: Appears intact Memory: Appears intact (Age Appropriate ability) Awareness: Appears intact Problem Solving: Appears intact Safety/Judgment: Appears intact       Comprehension  Auditory Comprehension Overall Auditory Comprehension: Appears within functional limits for tasks assessed Yes/No Questions: Within Functional Limits Commands: Within Functional Limits Conversation: Simple Visual Recognition/Discrimination Discrimination: Not tested Reading Comprehension Reading Status: Not tested    Expression Expression Primary Mode of Expression: Verbal Verbal Expression Overall Verbal Expression: Appears within functional limits for tasks assessed Initiation: No impairment Level of Generative/Spontaneous Verbalization: Conversation Repetition: No impairment Naming: No impairment Pragmatics: No impairment Non-Verbal Means of Communication: Not applicable Written Expression Written Expression: Not tested   Oral / Motor  Oral Motor/Sensory Function Overall Oral Motor/Sensory Function: Mild impairment Facial ROM: Reduced left (Mild  impairment on  left - pt functional) Facial Symmetry: Within Functional Limits Facial Strength: Within Functional Limits Facial Sensation: Within Functional Limits Lingual ROM: Within Functional Limits Lingual Symmetry: Within Functional Limits Lingual Strength: Within Functional Limits Lingual Sensation: Within Functional Limits Velum: Within Functional Limits Mandible: Within Functional Limits Motor Speech Overall Motor Speech: Appears within functional limits for tasks assessed Respiration: Within functional limits Phonation: Normal Resonance: Within functional limits Articulation: Within functional limitis Intelligibility: Intelligible Motor Planning: Witnin functional limits Motor Speech Errors: Not applicable   GO                   Sheila Byrd, M.S., CCC-SLP Speech-Language Pathologist [phone] Sheila Byrd 07/09/2016, 2:15 PM

## 2016-07-30 ENCOUNTER — Encounter: Payer: Self-pay | Admitting: Surgery

## 2016-08-08 ENCOUNTER — Encounter: Payer: Self-pay | Admitting: Surgery

## 2016-08-08 ENCOUNTER — Ambulatory Visit (INDEPENDENT_AMBULATORY_CARE_PROVIDER_SITE_OTHER): Payer: Medicare Other | Admitting: Surgery

## 2016-08-08 VITALS — BP 142/74 | HR 62 | Temp 97.1°F | Resp 16 | Ht 60.0 in | Wt 100.0 lb

## 2016-08-08 DIAGNOSIS — I639 Cerebral infarction, unspecified: Secondary | ICD-10-CM | POA: Diagnosis not present

## 2016-08-08 DIAGNOSIS — I6522 Occlusion and stenosis of left carotid artery: Secondary | ICD-10-CM | POA: Diagnosis not present

## 2016-08-08 NOTE — Progress Notes (Signed)
[facility] of [place]  Patient name: Sheila BickersRuth Ruybal MRN: [id number] DOB: May 02, 1927 Sex: female  REASON FOR VISIT: follow up  HPI: Sheila BickersRuth Blane is a [age] y.o. female returns today for follow-up from hospital admission.  She initially presented to the hospital in November 2017 after an episode of dizziness.  This resolved.  The patient has had several episodes in the past which have been described as secondary to dehydration from the daughter.  They felt they were related to electrolytes.  Her workup included a head CT which was negative for an acute process.  She did have an MRI of the brain which showed nothing acute but bilateral remote infarcts.  She was also found to have a high-grade left carotid stenosis.  I felt that the patient was extremely high risk for surgery and given the fact that there have been no acute stroke identified that we would further evaluate her as an outpatient  According to the daughter the patient is able to walk around the house and will occasionally go out with the family to the store.  She only takes an aspirin for antiplatelet therapy.  She is a nonsmoker.  Past Medical History:  Diagnosis Date  . A-fib (HCC)   . Cellulitis   . Dehydration   . Stroke [facility](HCC)     History reviewed. No pertinent family history.  SOCIAL HISTORY: Social History  Substance Use Topics  . Smoking status: Never Smoker  . Smokeless tobacco: Never Used  . Alcohol use No    No Known Allergies  Current Outpatient Prescriptions  Medication Sig Dispense Refill  . aspirin EC 81 MG EC tablet Take 1 tablet (81 mg total) by mouth daily.     No current facility-administered medications for this visit.     REVIEW OF SYSTEMS:  [X]  denotes positive finding, [ ]  denotes negative finding Cardiac  Comments:  Chest pain or chest pressure:    Shortness of breath upon exertion:    Short of breath when lying flat:    Irregular heart  rhythm:        Vascular    Pain in calf, thigh, or hip brought on by ambulation:    Pain in feet at night that wakes you up from your sleep:     Blood clot in your veins:    Leg swelling:         Pulmonary    Oxygen at home:    Productive cough:     Wheezing:         Neurologic    Sudden weakness in arms or legs:     Sudden numbness in arms or legs:     Sudden onset of difficulty speaking or slurred speech:    Temporary loss of vision in one eye:     Problems with dizziness:         Gastrointestinal    Blood in stool:     Vomited blood:         Genitourinary    Burning when urinating:     Blood in urine:        Psychiatric    Major depression:         Hematologic    Bleeding problems:    Problems with blood clotting too easily:        Skin    Rashes or ulcers:        Constitutional    Fever or chills:      PHYSICAL  EXAM: Vitals:   08/08/16 1345 08/08/16 1348  BP: 137/72 (!) 142/74  Pulse: 62   Resp: 16   Temp: 97.1 F (36.2 C)   TempSrc: Oral   SpO2: 99%   Weight: 100 lb (45.4 kg)   Height: 5' (1.524 m)     GENERAL: The patient is a well-nourished female, in no acute distress. The vital signs are documented above. CARDIAC: There is a regular rate and rhythm.  PULMONARY: Non-labored respirations ABDOMEN: Soft and non-tender with normal pitched bowel sounds.  MUSCULOSKELETAL: There are no major deformities or cyanosis. NEUROLOGIC: No focal weakness or paresthesias are detected. SKIN: There are no ulcers or rashes noted. PSYCHIATRIC: The patient has a normal affect.  DATA:  none  MEDICAL ISSUES: I have reviewed the patient's imaging studies.  There is not a significant amount of plaque along the aortic arch.  I discussed with the family that I still feel that she is extremely high risk for carotid endarterectomy and in fact I would not recommend proceeding with carotid endarterectomy under general anesthesia.  We discussed the possibility of carotid  stenting, despite her age, she has relatively little plaque within her aortic arch and I think that she would be a candidate for left carotid stenting.  After lengthy discussion, we have elected to continue to monitor the patient.  I have her scheduled to follow-up with me in 6 months with a repeat carotid duplex.    Durene CalWells Bernisha Verma, MD [facility] Tel [phone] Pager [phone]

## 2016-08-12 NOTE — Addendum Note (Signed)
Addended by: Burton ApleyPETTY, Cherylee Rawlinson A on: 08/12/2016 11:54 AM   Modules accepted: Orders

## 2016-08-28 ENCOUNTER — Encounter: Payer: Self-pay | Admitting: Neurology

## 2016-08-28 ENCOUNTER — Ambulatory Visit (INDEPENDENT_AMBULATORY_CARE_PROVIDER_SITE_OTHER): Payer: Medicare Other | Admitting: Neurology

## 2016-08-28 VITALS — BP 151/88 | HR 72 | Ht 60.0 in | Wt 105.0 lb

## 2016-08-28 DIAGNOSIS — I6522 Occlusion and stenosis of left carotid artery: Secondary | ICD-10-CM

## 2016-08-28 NOTE — Progress Notes (Signed)
Reason for visit: TIA  Referring physician: [facility]  Sheila Byrd is a 83 y.o. female  History of present illness:  Sheila Byrd is an 83 year old right-handed white female with a history of episodes of dizziness that have been associated with dehydration in the past. The first such episode occurred in March 2014. The patient got better with IV hydration, but she has had some right-sided arm weakness since that time. The patient does not have a primary care physician. She is on no medications. She was admitted for her usual dizzy episode around 07/08/2016. MRI brain evaluation did not show evidence of an acute stroke. The patient was found to have a high-grade greater than 80% stenosis of the left internal carotid artery. CT angiogram of the head has shown 75% stenosis of the supraclinoid carotid artery. The patient had a 2-D echocardiogram that was relatively unremarkable. The patient was seen by vascular surgery, it was felt that the patient is high risk for surgery and the possibility of a stent placement was entertained. The patient has remained at her usual baseline. She is able to walk some with assistance. The patient has not had any recent falls. She denies any numbness of the extremities or any significant issues with controlling the bowels or the bladder. She denies any significant memory problems. The patient does require some assistance with activities of daily living from the daughter. The patient was placed on aspirin when in the hospital. This is the only medication that she currently takes. She is sent to this office for an evaluation.  Past Medical History:  Diagnosis Date  . A-fib (HCC)   . Cellulitis   . Dehydration   . Stroke [facility])     Past Surgical History:  Procedure Laterality Date  . TONSILLECTOMY  1954    Family History  Problem Relation Age of Onset  . Stroke Mother   . Heart disease Father     Social history:  reports that she has never smoked. She has  never used smokeless tobacco. She reports that she does not drink alcohol or use drugs.  Medications:  Prior to Admission medications   Medication Sig Start Date End Date Taking? Authorizing Provider  aspirin EC 81 MG EC tablet Take 1 tablet (81 mg total) by mouth daily. 07/09/16   Joseph Art, DO     No Known Allergies  ROS:  Out of a complete 14 system review of symptoms, the patient complains only of the following symptoms, and all other reviewed systems are negative.  Gait disorder  Blood pressure (!) 151/88, pulse 72, height 5' (1.524 m), weight 105 lb (47.6 kg).  Physical Exam  General: The patient is alert and cooperative at the time of the examination.  Eyes: Pupils are equal, round, and reactive to light. Discs are flat bilaterally.  Neck: The neck is supple, no carotid bruits are noted.  Respiratory: The respiratory examination is clear.  Cardiovascular: The cardiovascular examination reveals a regular rate and rhythm, no obvious murmurs or rubs are noted.  Skin: Extremities are without significant edema.  Neurologic Exam  Mental status: The patient is alert and oriented x 3 at the time of the examination. The patient has apparent normal recent and remote memory, with an apparently normal attention span and concentration ability.  Cranial nerves: Facial symmetry is present. There is good sensation of the face to pinprick and soft touch bilaterally. The strength of the facial muscles and the muscles to head turning and  shoulder shrug are normal bilaterally. Speech is hypophonic, not aphasic. Extraocular movements are full. Visual fields are full. The tongue is midline, and the patient has symmetric elevation of the soft palate. No obvious hearing deficits are noted.  Motor: The motor testing reveals 5 over 5 strength of all 4 extremities, with exception of some decreased grip in the right hand, dystonic posturing of the right hand. Good symmetric motor tone is noted  throughout.  Sensory: Sensory testing is intact to pinprick, soft touch, vibration sensation, and position sense on all 4 extremities. No evidence of extinction is noted.  Coordination: Cerebellar testing reveals good finger-nose-finger and heel-to-shin bilaterally. Some apraxia with the use of the lower extremities is noted.  Gait and station: Gait is unsteady, the patient is able to ambulate with assistance. Tandem gait was not attempted Romberg is negative. No drift is seen.  Reflexes: Deep tendon reflexes are symmetric and normal bilaterally. Toes are downgoing bilaterally.   Assessment/Plan:  1. Cerebrovascular disease, left carotid stenosis  2. Gait disorder  The patient is on low-dose aspirin. The patient has evidence of high-grade left internal carotid artery stenosis extracranially as well as 75% stenosis intracranially. The patient has no other significant medical issues, 2-D echocardiogram shows good cardiac function. The patient does not have any significant respiratory disease. In individuals over the age of 81, carotid endarterectomy is the preferred treatment for high-grade carotid stenosis. The patient is being followed through vascular surgery. She will remain on aspirin at this time. She will follow-up if needed.  Marlan Palau. Keith Endya Austin MD 08/28/2016 2:55 PM  [facility] [address] [place], [place] [id number]  Phone [phone] Fax [phone]

## 2017-02-02 ENCOUNTER — Encounter: Payer: Self-pay | Admitting: Surgery

## 2017-02-09 ENCOUNTER — Encounter: Payer: Self-pay | Admitting: Surgery

## 2017-02-09 ENCOUNTER — Ambulatory Visit (HOSPITAL_COMMUNITY)
Admission: RE | Admit: 2017-02-09 | Discharge: 2017-02-09 | Disposition: A | Discharge status: Home / Self Care | Payer: Medicare Other | Source: Ambulatory Visit | Attending: Surgery | Admitting: Surgery

## 2017-02-09 ENCOUNTER — Ambulatory Visit (INDEPENDENT_AMBULATORY_CARE_PROVIDER_SITE_OTHER): Payer: Medicare Other | Admitting: Surgery

## 2017-02-09 VITALS — BP 128/71 | HR 63 | Temp 98.1°F | Resp 16 | Ht 60.0 in | Wt 100.0 lb

## 2017-02-09 DIAGNOSIS — I6523 Occlusion and stenosis of bilateral carotid arteries: Secondary | ICD-10-CM | POA: Insufficient documentation

## 2017-02-09 DIAGNOSIS — I6522 Occlusion and stenosis of left carotid artery: Secondary | ICD-10-CM

## 2017-02-09 LAB — VAS US CAROTID
LCCADSYS: -35 cm/s
LEFT ECA DIAS: -2 cm/s
LEFT VERTEBRAL DIAS: 13 cm/s
LICAPDIAS: 89 cm/s
Left CCA dist dias: -2 cm/s
Left CCA prox dias: 2 cm/s
Left CCA prox sys: 57 cm/s
Left ICA dist dias: -20 cm/s
Left ICA dist sys: -62 cm/s
Left ICA prox sys: 445 cm/s
RCCAPDIAS: -15 cm/s
RCCAPSYS: -105 cm/s
RIGHT CCA MID DIAS: 17 cm/s
RIGHT VERTEBRAL DIAS: 7 cm/s
Right cca dist sys: -123 cm/s

## 2017-02-09 NOTE — Progress Notes (Signed)
[facility]  Patient name: Sheila Byrd Didonato MRN: [id number] DOB: 1927-01-01 Sex: female   REASON FOR VISIT:    Follow up carotid  HISOTRY OF PRESENT ILLNESS:    Sheila Byrd Cofer is a 83 y.o. female returns today for follow-up from hospital admission.  She initially presented to the hospital in November 2017 after an episode of dizziness.  This resolved.  The patient has had several episodes in the past which have been described as secondary to dehydration from the daughter.  They felt they were related to electrolytes.  Her workup included a head CT which was negative for an acute process.  She did have an MRI of the brain which showed nothing acute but bilateral remote infarcts.  She was also found to have a high-grade left carotid stenosis.  I felt that the patient was extremely high risk for surgery and given the fact that there have been no acute stroke identified that we would further evaluate her as an outpatient  The patient has not had any episodes since her hospitalization, now that she is taking an aspirin.  She denies any neurologic symptoms such as amaurosis fugax, numbness or weakness in either extremity or slurred speech.  She does ambulate on her own and is becoming more active.  PAST MEDICAL HISTORY:   Past Medical History:  Diagnosis Date  . A-fib (HCC)   . Cellulitis   . Dehydration   . Stroke [facility](HCC)      FAMILY HISTORY:   Family History  Problem Relation Age of Onset  . Stroke Mother   . Heart disease Father     SOCIAL HISTORY:   Social History  Substance Use Topics  . Smoking status: Never Smoker  . Smokeless tobacco: Never Used  . Alcohol use No     ALLERGIES:   No Known Allergies   CURRENT MEDICATIONS:   Current Outpatient Prescriptions  Medication Sig Dispense Refill  . aspirin EC 81 MG EC tablet Take 1 tablet (81 mg total) by mouth daily.     No current facility-administered  medications for this visit.     REVIEW OF SYSTEMS:   [X]  denotes positive finding, [ ]  denotes negative finding Cardiac  Comments:  Chest pain or chest pressure:    Shortness of breath upon exertion: x   Short of breath when lying flat:    Irregular heart rhythm:        Vascular    Pain in calf, thigh, or hip brought on by ambulation:    Pain in feet at night that wakes you up from your sleep:  x   Blood clot in your veins:    Leg swelling:         Pulmonary    Oxygen at home:    Productive cough:  x   Wheezing:         Neurologic    Sudden weakness in arms or legs:     Sudden numbness in arms or legs:     Sudden onset of difficulty speaking or slurred speech:    Temporary loss of vision in one eye:     Problems with dizziness:         Gastrointestinal    Blood in stool:     Vomited blood:         Genitourinary    Burning when urinating:     Blood in urine:        Psychiatric    Major  depression:         Hematologic    Bleeding problems:    Problems with blood clotting too easily:        Skin    Rashes or ulcers:        Constitutional    Fever or chills:      PHYSICAL EXAM:   Vitals:   02/09/17 1455 02/09/17 1458  BP: (!) 146/70 128/71  Pulse: 63   Resp: 16   Temp: 98.1 F (36.7 C)   TempSrc: Oral   SpO2: 96%   Weight: 100 lb (45.4 kg)   Height: 5' (1.524 m)     GENERAL: The patient is a well-nourished female, in no acute distress. The vital signs are documented above. CARDIAC: There is a regular rate and rhythm.  PULMONARY: Non-labored respirations MUSCULOSKELETAL: There are no major deformities or cyanosis. NEUROLOGIC: No focal weakness or paresthesias are detected. SKIN: There are no ulcers or rashes noted. PSYCHIATRIC: The patient has a normal affect.  STUDIES:   I have reviewed her carotid duplex.  This again shows greater than 80% left carotid stenosis.  MEDICAL ISSUES:   Asymptomatic left carotid stenosis.  I had a lengthy  discussion with the patient and her daughter regarding treatment options.  The patient's health looks much improved today than 6 months ago.  I am entertaining left carotid endarterectomy.  I would like to get formal cardiology clearance as well as medical clearance from her PCP.  She will follow-up with me in 6-8 weeks for further discussions regarding treatment options.  She may also be a candidate for TCAR    Durene Cal, MD [facility] Specialists of [facility] [phone] Pager [phone]

## 2017-02-27 ENCOUNTER — Telehealth: Payer: Self-pay | Admitting: Surgery

## 2017-02-27 NOTE — Telephone Encounter (Signed)
Per [facility]'s instructions on 02/09/17 the patient was to see a cardiologist for pre-operative clearance. The patient declined that appointment and the daughter Annice PihJackie stated that she would call us when and if her mother decided to proceed with any future surgeries. We cancelled the appt scheduled for her to see [facility] on 03/30/17 and the patient and her daughter is aware of this. awt

## 2017-03-20 IMAGING — DX DG CHEST 2V
2 series · 2 of 2 positions shown · non-contrast
Comparison: 06/07/2016 chest radiograph.

CLINICAL DATA: CVA

EXAM:
CHEST  2 VIEW

[w chest pa]
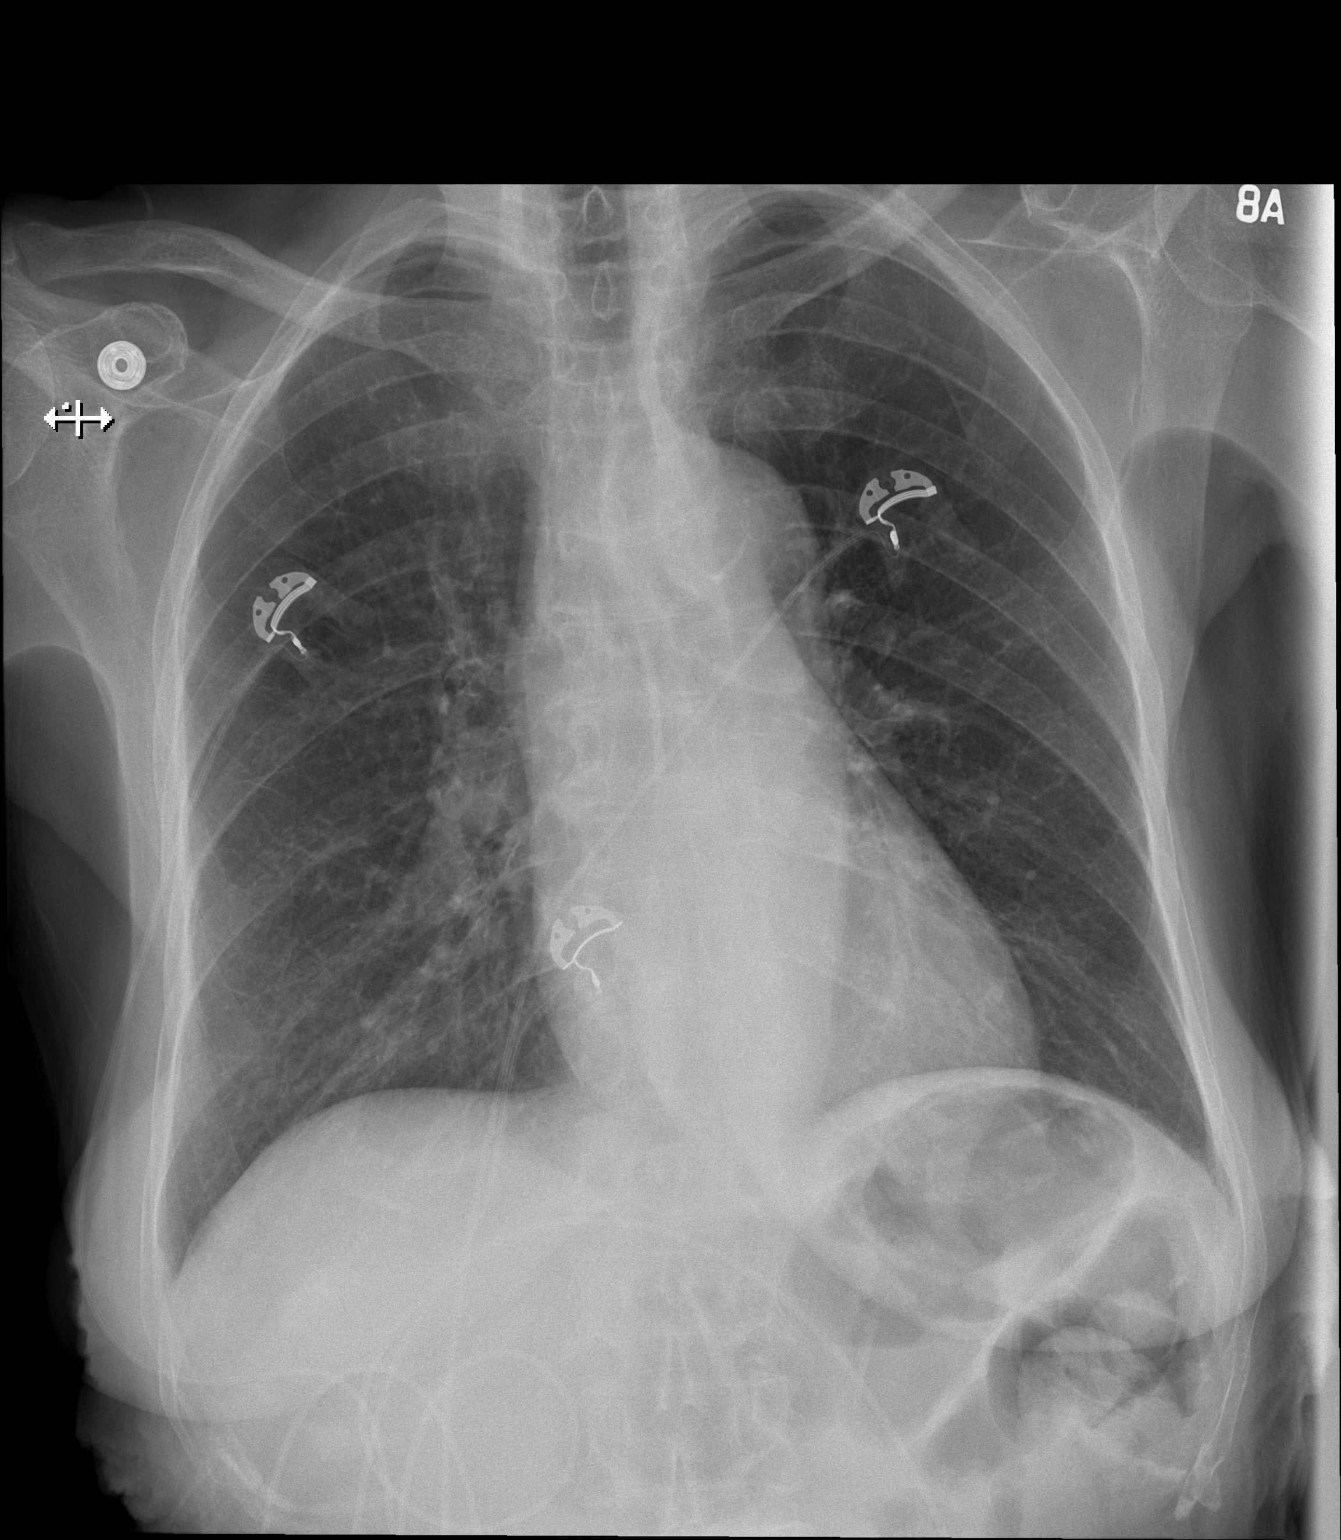

[w chest lat]
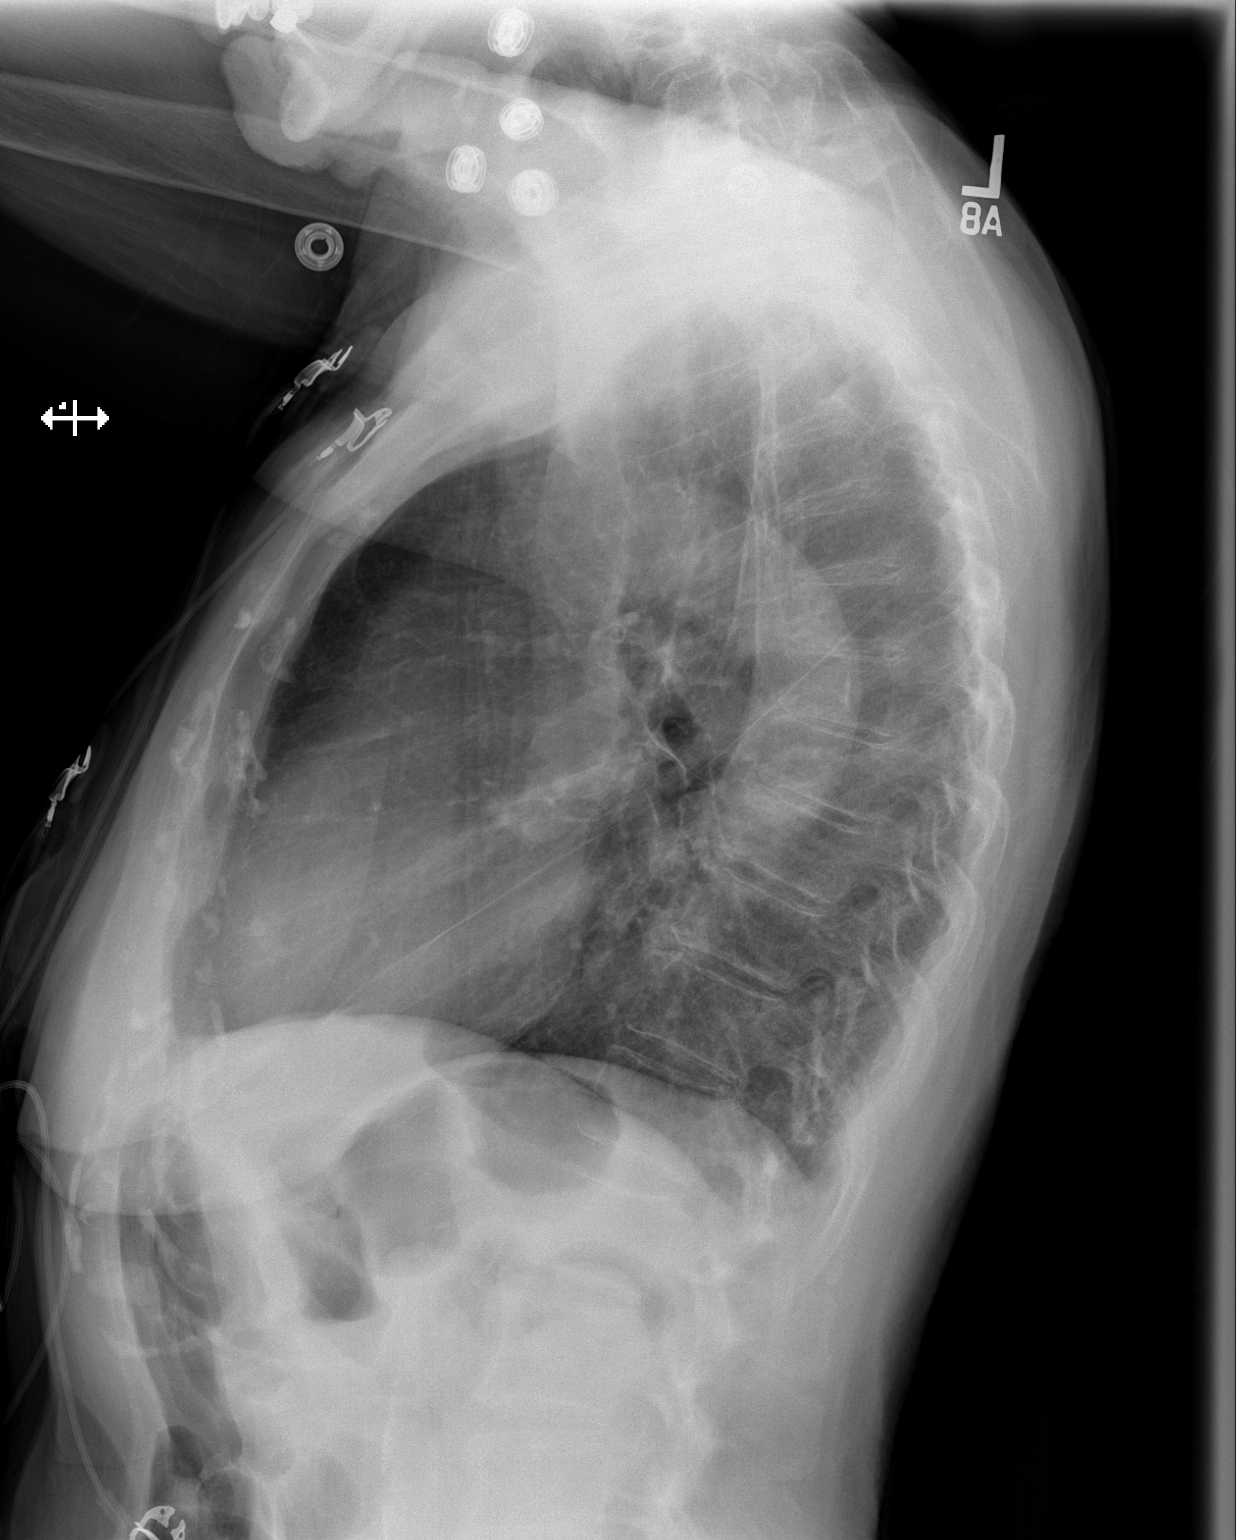

[2 of 2 positions shown; findings below may reference images not displayed]

FINDINGS: Stable cardiomediastinal silhouette with normal heart size and
aortic atherosclerosis. No pneumothorax. No pleural effusion. Lungs
appear clear, with no acute consolidative airspace disease and no
pulmonary edema.
IMPRESSION: No active cardiopulmonary disease.

Aortic atherosclerosis.

## 2017-03-20 IMAGING — CT CT HEAD W/O CM
3 series · 15 of 46 positions shown, 18 images · non-contrast
Comparison: 06/07/2016 and 11/12/2012 head CT.

CLINICAL DATA: 88-year-old female with dizziness. Initial
encounter.

EXAM:
CT HEAD WITHOUT CONTRAST
TECHNIQUE: Contiguous axial images were obtained from the base of the skull
through the vertex without intravenous contrast.

[Series 2: head wo · axial · 0.39mm/px · z∈[-153,-33]mm · 9 of 29 slices shown, 12 images]
[im 3/29  brain]
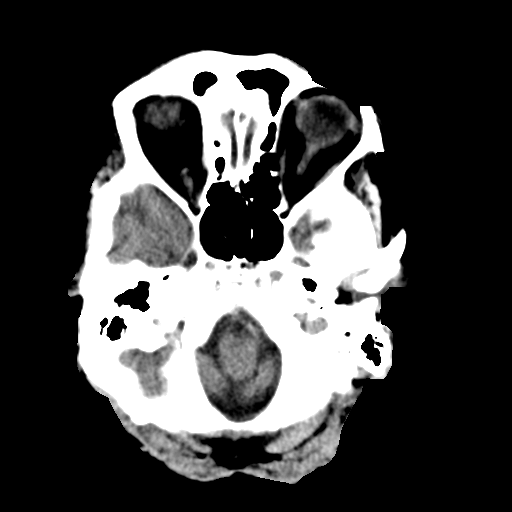
[im 3/29  bone]
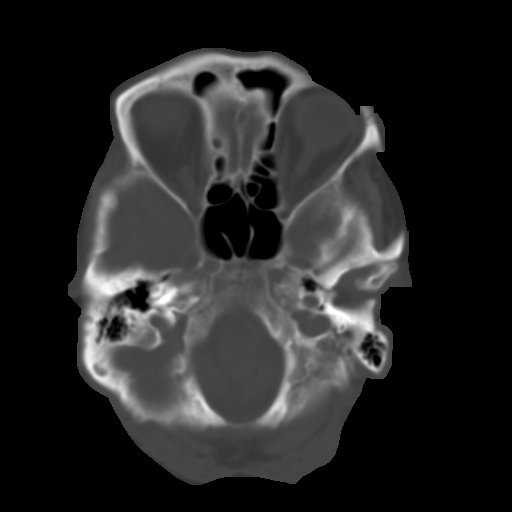
[im 6/29  brain]
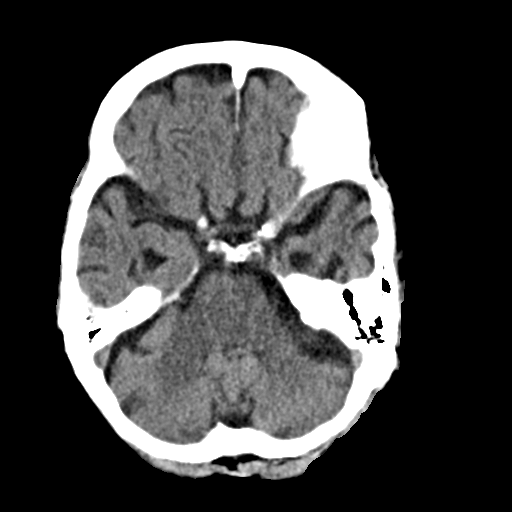
[im 9/29  brain]
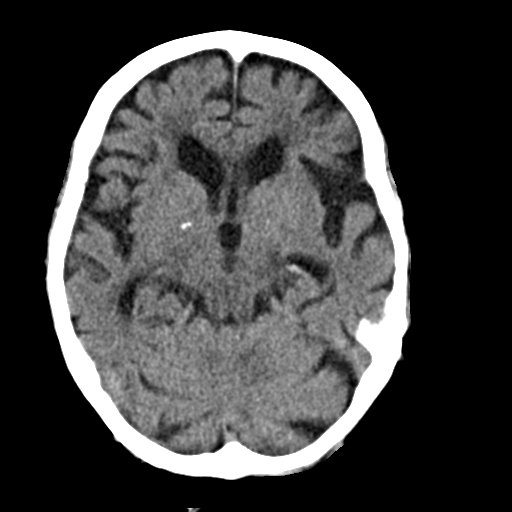
[im 12/29  brain]
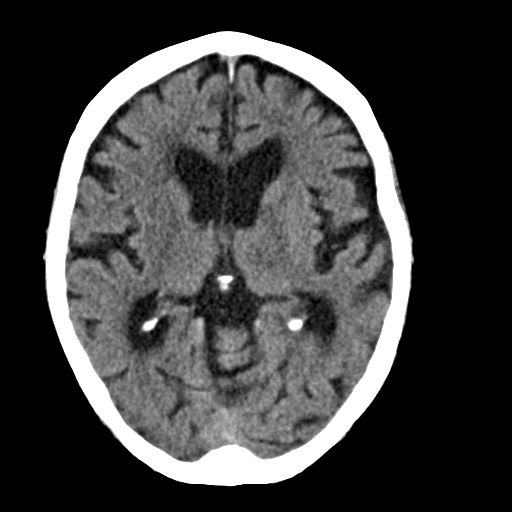
[im 15/29  brain]
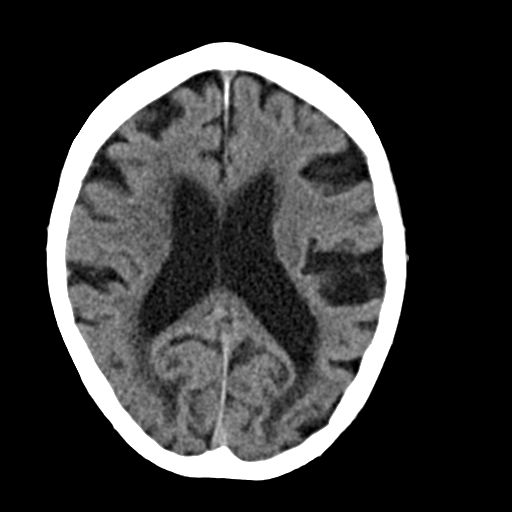
[im 15/29  bone]
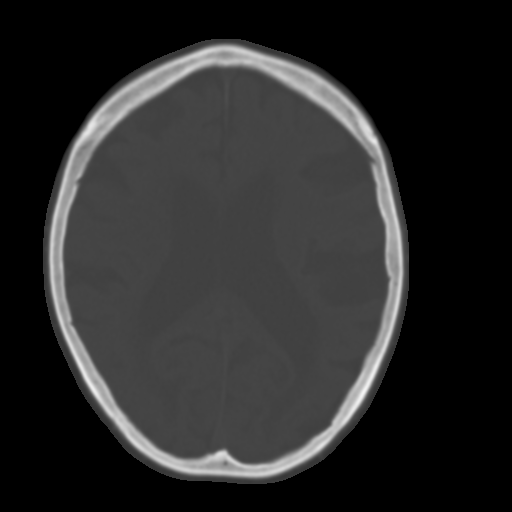
[im 18/29  brain]
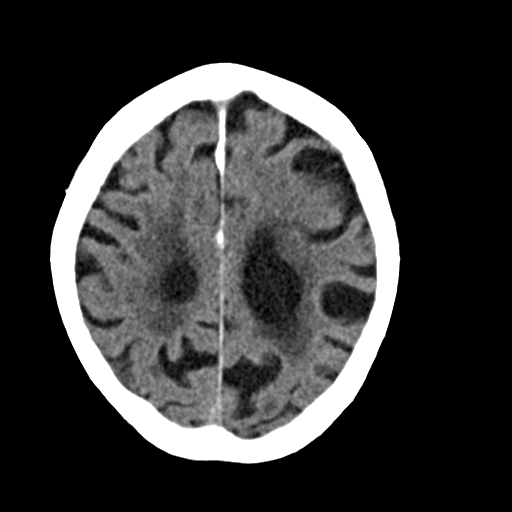
[im 21/29  brain]
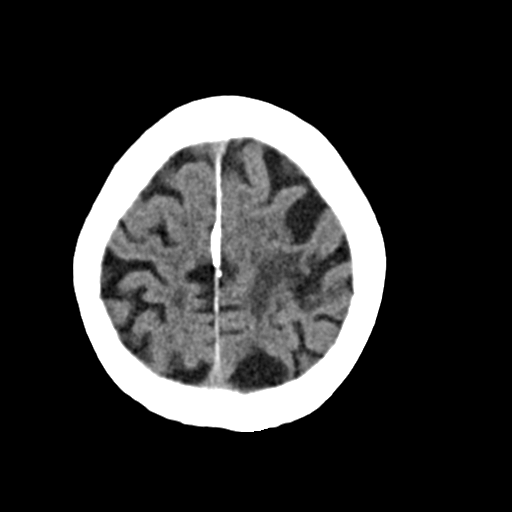
[im 24/29  brain]
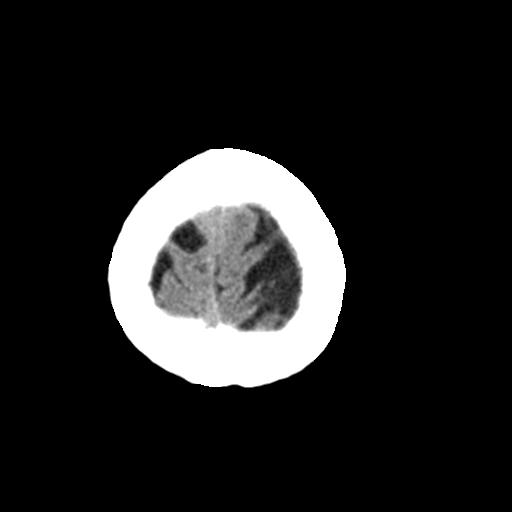
[im 27/29  brain]
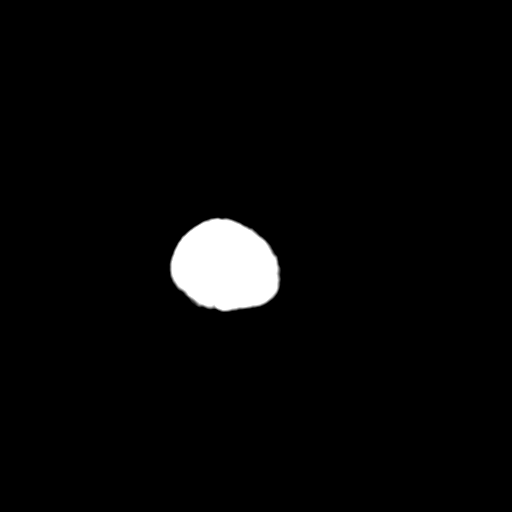
[im 27/29  bone]
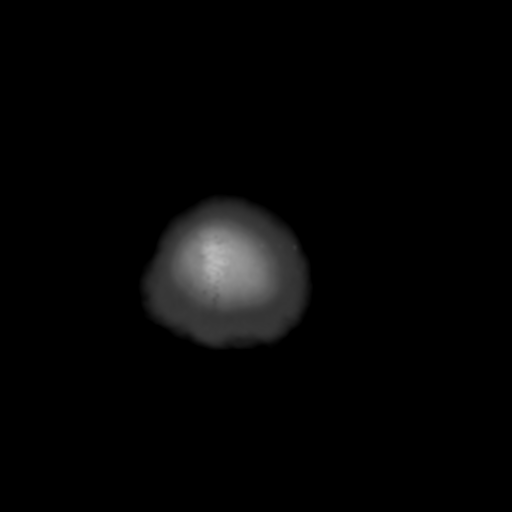

[Series 4: coronal soft · coronal · 0.28mm/px · 3 of 67 slices shown]
[im 23/67  brain]
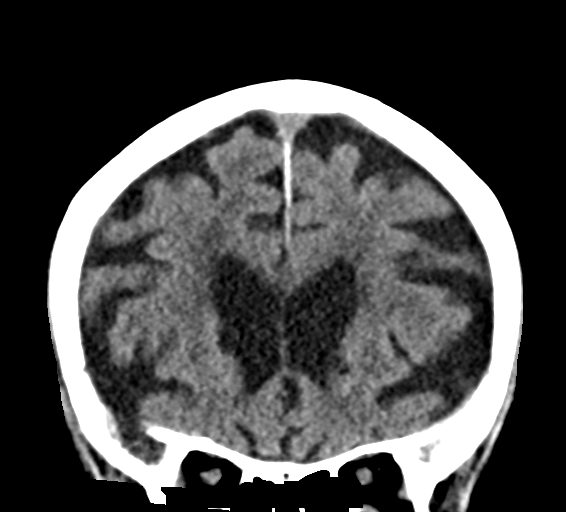
[im 30/67  brain]
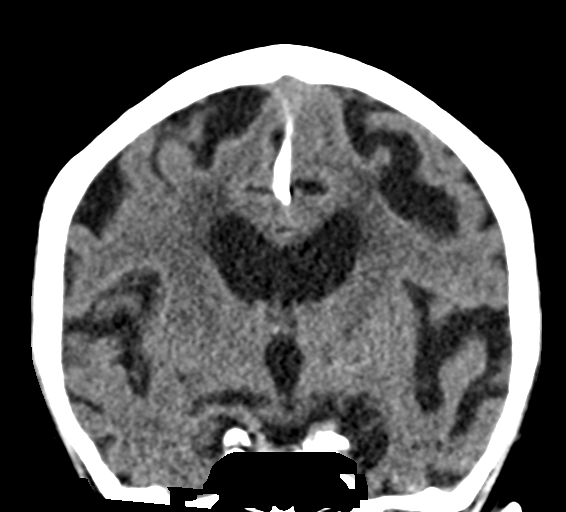
[im 37/67  brain]
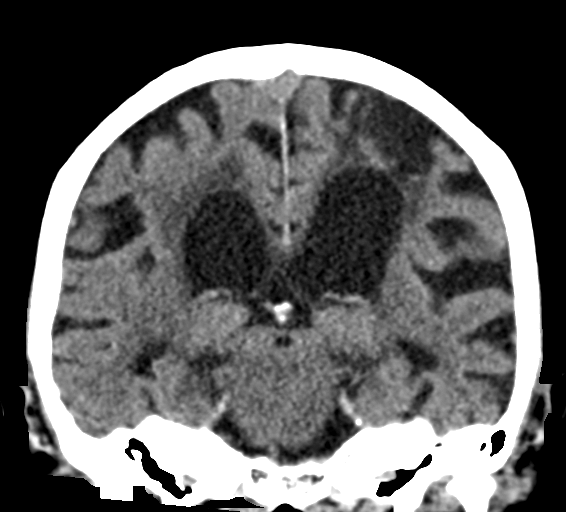

[Series 5: sag soft · sagittal · 0.27mm/px · 3 of 54 slices shown]
[im 18/54  brain]
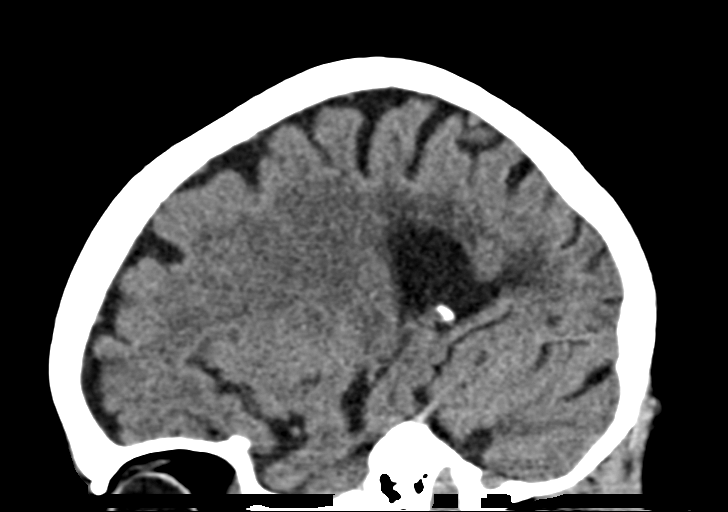
[im 27/54  brain]
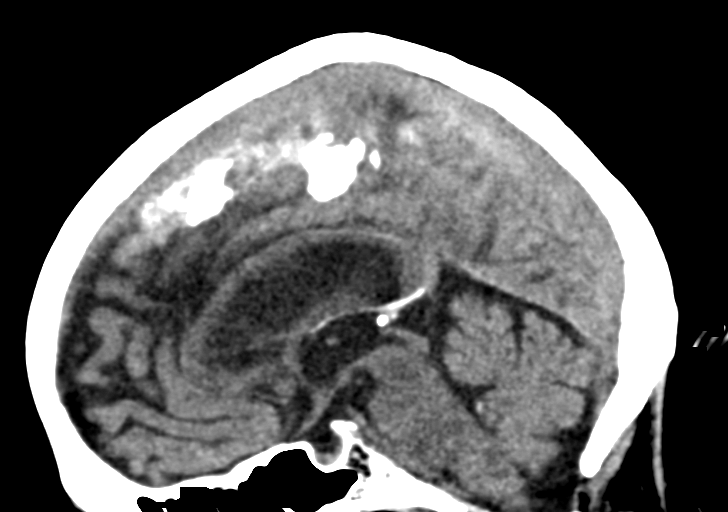
[im 36/54  brain]
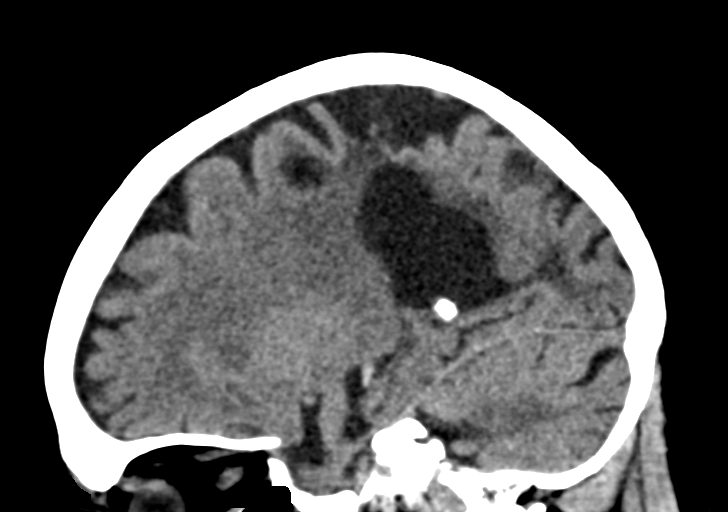

[15 of 46 positions shown; findings below may reference images not displayed]

FINDINGS: Brain: Remote left parietal lobe infarct. Remote right caudate head
infarct. Chronic microvascular changes.

No intracranial hemorrhage or CT evidence of large acute infarct.

No intracranial mass lesion noted on this unenhanced exam.

Vascular: Vascular calcifications.

Skull: No acute abnormality.

Sinuses/Orbits: Visualized orbits unremarkable. Visualized paranasal
sinuses and mastoid air cells are clear.

Other: Negative
IMPRESSION: No intracranial hemorrhage or CT evidence of large acute infarct.

Remote left parietal lobe infarct.

Remote right caudate head infarct.

Chronic microvascular changes.

Global atrophy without hydrocephalus.

## 2017-03-30 ENCOUNTER — Ambulatory Visit: Payer: Medicare Other | Admitting: Surgery

## 2018-04-20 ENCOUNTER — Emergency Department (HOSPITAL_BASED_OUTPATIENT_CLINIC_OR_DEPARTMENT_OTHER)
Admission: EM | Admit: 2018-04-20 | Discharge: 2018-04-20 | Disposition: A | Discharge status: Home / Self Care | Payer: Medicare Other | Attending: Emergency Medicine | Admitting: Emergency Medicine

## 2018-04-20 ENCOUNTER — Encounter (HOSPITAL_BASED_OUTPATIENT_CLINIC_OR_DEPARTMENT_OTHER): Payer: Self-pay | Admitting: *Deleted

## 2018-04-20 ENCOUNTER — Other Ambulatory Visit: Payer: Self-pay

## 2018-04-20 ENCOUNTER — Emergency Department (HOSPITAL_BASED_OUTPATIENT_CLINIC_OR_DEPARTMENT_OTHER): Payer: Medicare Other

## 2018-04-20 DIAGNOSIS — Z8673 Personal history of transient ischemic attack (TIA), and cerebral infarction without residual deficits: Secondary | ICD-10-CM | POA: Insufficient documentation

## 2018-04-20 DIAGNOSIS — N390 Urinary tract infection, site not specified: Secondary | ICD-10-CM | POA: Diagnosis not present

## 2018-04-20 DIAGNOSIS — R42 Dizziness and giddiness: Secondary | ICD-10-CM

## 2018-04-20 DIAGNOSIS — Z8679 Personal history of other diseases of the circulatory system: Secondary | ICD-10-CM | POA: Diagnosis not present

## 2018-04-20 DIAGNOSIS — Z7982 Long term (current) use of aspirin: Secondary | ICD-10-CM | POA: Insufficient documentation

## 2018-04-20 LAB — URINALYSIS, ROUTINE W REFLEX MICROSCOPIC
Bilirubin Urine: NEGATIVE
GLUCOSE, UA: NEGATIVE mg/dL
Ketones, ur: NEGATIVE mg/dL
Nitrite: POSITIVE — AB
PROTEIN: NEGATIVE mg/dL
pH: 7.5 (ref 5.0–8.0)

## 2018-04-20 LAB — COMPREHENSIVE METABOLIC PANEL
ALBUMIN: 3.5 g/dL (ref 3.5–5.0)
ALT: 10 U/L (ref 0–44)
AST: 20 U/L (ref 15–41)
Alkaline Phosphatase: 76 U/L (ref 38–126)
Anion gap: 10 (ref 5–15)
BUN: 14 mg/dL (ref 8–23)
CO2: 26 mmol/L (ref 22–32)
Calcium: 8.4 mg/dL — ABNORMAL LOW (ref 8.9–10.3)
Chloride: 100 mmol/L (ref 98–111)
Creatinine, Ser: 0.77 mg/dL (ref 0.44–1.00)
GFR calc Af Amer: >60 mL/min (ref >60)
GFR calc non Af Amer: >60 mL/min (ref >60)
GLUCOSE: 95 mg/dL (ref 70–99)
POTASSIUM: 4.4 mmol/L (ref 3.5–5.1)
Sodium: 136 mmol/L (ref 135–145)
Total Bilirubin: 0.4 mg/dL (ref 0.3–1.2)
Total Protein: 7.1 g/dL (ref 6.5–8.1)

## 2018-04-20 LAB — CBC
HCT: 37.2 % (ref 36.0–46.0)
HEMOGLOBIN: 12.2 g/dL (ref 12.0–15.0)
MCH: 32.4 pg (ref 26.0–34.0)
MCHC: 32.8 g/dL (ref 30.0–36.0)
MCV: 98.9 fL (ref 78.0–100.0)
Platelets: 232 10*3/uL (ref 150–400)
RBC: 3.76 MIL/uL — ABNORMAL LOW (ref 3.87–5.11)
RDW: 12.4 % (ref 11.5–15.5)
WBC: 4.6 10*3/uL (ref 4.0–10.5)

## 2018-04-20 LAB — DIFFERENTIAL
BASOS ABS: 0 10*3/uL (ref 0.0–0.1)
BASOS PCT: 1 %
EOS ABS: 0 10*3/uL (ref 0.0–0.7)
Eosinophils Relative: 1 %
LYMPHS ABS: 1.1 10*3/uL (ref 0.7–4.0)
Lymphocytes Relative: 23 %
Monocytes Absolute: 0.4 10*3/uL (ref 0.1–1.0)
Monocytes Relative: 9 %
NEUTROS PCT: 66 %
Neutro Abs: 3 10*3/uL (ref 1.7–7.7)

## 2018-04-20 LAB — URINALYSIS, MICROSCOPIC (REFLEX): WBC, UA: >50 WBC/hpf (ref 0–5)

## 2018-04-20 LAB — APTT: APTT: 28 s (ref 24–36)

## 2018-04-20 LAB — TROPONIN I: Troponin I: <0.03 ng/mL (ref <0.03)

## 2018-04-20 LAB — PROTIME-INR
INR: 0.97
Prothrombin Time: 12.8 seconds (ref 11.4–15.2)

## 2018-04-20 MED ORDER — SODIUM CHLORIDE 0.9 % IV SOLN
1.0000 g | Freq: Once | INTRAVENOUS | Status: AC
  Administered 2018-04-20: 1 g via INTRAVENOUS
  Filled 2018-04-20: qty 10

## 2018-04-20 MED ORDER — CEPHALEXIN 500 MG PO CAPS: 500.0000 mg | ORAL_CAPSULE | Freq: Two times a day (BID) | ORAL | 0 refills | Status: DC

## 2018-04-20 NOTE — ED Provider Notes (Signed)
[facility] EMERGENCY DEPARTMENT Provider Note   CSN: [id number] Arrival date & time: 04/20/18  1555     History   Chief Complaint Chief Complaint  Patient presents with  . Dizziness    HPI Sheila Byrd is a 84 y.o. female.  The history is provided by a relative and the patient. No language interpreter was used.  Dizziness   Sheila Byrd is a 84 y.o. female who presents to the Emergency Department complaining of dizziness. The five caveat due to confusion. History is provided by the patient's daughter. Sheila Byrd presents from home for evaluation of dizziness that began today. When she awoke this morning she was in her routine state of health. When her daughter went to get her for lunch around 140 she noted that it was difficult to get her out of bed because she was dizzy. She let her lay back down and woke her later. She was feeling improved at that time and was able to get to the table for lunch but did have ongoing dizziness. Dizziness is difficult to describe but the patient states that it feels difficult to walk. Symptoms are better at rest. No fevers, nausea, vomiting, abdominal pain, chest pain, dysuria. Daughter states that she has had similar symptoms in the past and they were due to UTI.  Daughter notes that she has persistent right right upper extremity weakness due to prior stroke. Past Medical History:  Diagnosis Date  . A-fib (HCC)   . Cellulitis   . Dehydration   . Stroke [facility])     Patient Active Problem List   Diagnosis Date Noted  . Left carotid stenosis 07/09/2016  . Dehydration   . Dizziness 07/07/2016    Past Surgical History:  Procedure Laterality Date  . TONSILLECTOMY  1954     OB History   None      Home Medications    Prior to Admission medications   Medication Sig Start Date End Date Taking? Authorizing Provider  aspirin EC 81 MG EC tablet Take 1 tablet (81 mg total) by mouth daily. 07/09/16  Yes Vann, Jessica U, DO  cephALEXin  (KEFLEX) 500 MG capsule Take 1 capsule (500 mg total) by mouth 2 (two) times daily. 04/20/18   Tilden Fossa, MD    Family History Family History  Problem Relation Age of Onset  . Stroke Mother   . Heart disease Father     Social History Social History   Tobacco Use  . Smoking status: Never Smoker  . Smokeless tobacco: Never Used  Substance Use Topics  . Alcohol use: No  . Drug use: No     Allergies   Patient has no known allergies.   Review of Systems Review of Systems  Neurological: Positive for dizziness.  All other systems reviewed and are negative.    Physical Exam Updated Vital Signs BP (!) 148/73   Pulse (!) 59   Temp 98.4 F (36.9 C)   Resp 17   Ht 5\' 1"  (1.549 m)   Wt 45.4 kg   SpO2 98%   BMI 18.91 kg/m   Physical Exam  Constitutional: She appears well-developed and well-nourished.  HENT:  Head: Normocephalic and atraumatic.  TMs obscured by cerumen bilaterally  Eyes: Pupils are equal, round, and reactive to light. EOM are normal.  Cardiovascular: Normal rate and regular rhythm.  No murmur heard. Pulmonary/Chest: Effort normal and breath sounds normal. No respiratory distress.  Abdominal: Soft. There is no tenderness. There is no rebound  and no guarding.  Musculoskeletal: She exhibits no edema or tenderness.  Neurological: She is alert.  Oriented to place. Disoriented to time in recent events. Right upper extremity drift with 4/5 strength in the right upper extremity. No facial asymmetry. Sensation to light touch intact in all four extremities. Five out of five strength and bilateral lower extremities. There is mild ataxia on heel to shin bilaterally. Unable to stand or ambulate without two person assist. Narrow base gait.  Skin: Skin is warm and dry.  Psychiatric: She has a normal mood and affect. Her behavior is normal.  Nursing note and vitals reviewed.    ED Treatments / Results  Labs (all labs ordered are listed, but only abnormal  results are displayed) Labs Reviewed  CBC - Abnormal; Notable for the following components:      Result Value   RBC 3.76 (*)    All other components within normal limits  COMPREHENSIVE METABOLIC PANEL - Abnormal; Notable for the following components:   Calcium 8.4 (*)    All other components within normal limits  URINALYSIS, ROUTINE W REFLEX MICROSCOPIC - Abnormal; Notable for the following components:   APPearance CLOUDY (*)    Specific Gravity, Urine <1.005 (*)    Hgb urine dipstick TRACE (*)    Nitrite POSITIVE (*)    Leukocytes, UA LARGE (*)    All other components within normal limits  URINALYSIS, MICROSCOPIC (REFLEX) - Abnormal; Notable for the following components:   Bacteria, UA MANY (*)    All other components within normal limits  URINE CULTURE  PROTIME-INR  APTT  DIFFERENTIAL  TROPONIN I    EKG EKG Interpretation  Date/Time:  Tuesday April 20 2018 16:15:28 EDT Ventricular Rate:  72 PR Interval:    QRS Duration: 87 QT Interval:  410 QTC Calculation: 449 R Axis:   15 Text Interpretation:  Atrial flutter with predominant 4:1 AV block Confirmed by Tilden Fossaees, Getsemani Lindon [phone]([id number]) on 04/20/2018 4:30:42 PM   Radiology Ct Head Wo Contrast  Result Date: 04/20/2018 CLINICAL DATA:  Dizziness onset today. EXAM: CT HEAD WITHOUT CONTRAST TECHNIQUE: Contiguous axial images were obtained from the base of the skull through the vertex without intravenous contrast. COMPARISON:  07/07/2016 CT FINDINGS: Brain: Superficial and central atrophy with chronic small vessel ischemia. Encephalomalacia in the left high parietal lobe with remote lacunar infarct of the caudate heads. Patent basilar cisterns and fourth ventricle. Atrophy of the cerebellum. Vascular: No hyperdense vessel sign. Atherosclerosis of the carotid siphons bilaterally. Skull: No skull fracture. Sinuses/Orbits: Postsurgical change about both orbits. Clear paranasal sinuses and mastoids. Other: None IMPRESSION: Atrophy with chronic  left parietal and bilateral caudate lacunar infarcts. Chronic small vessel ischemia. No acute intracranial abnormality. Electronically Signed   By: Tollie Ethavid  Kwon M.D.   On: 04/20/2018 18:20    Procedures Procedures (including critical care time)  Medications Ordered in ED Medications  cefTRIAXone (ROCEPHIN) 1 g in sodium chloride 0.9 % 100 mL IVPB (0 g Intravenous Stopped 04/20/18 2133)     Initial Impression / Assessment and Plan / ED Course  I have reviewed the triage vital signs and the nursing notes.  Pertinent labs & imaging results that were available during my care of the patient were reviewed by me and considered in my medical decision making (see chart for details).     Patient here for evaluation of dizziness and gait difficulties that began today. She does have gait difficulties at baseline per daughter and record review. She does have chronic right  upper extremity weakness on examination as well as gait ataxia, otherwise a nonfocal neurologic examination. She does not have any dizziness in the department. CT demonstrates prior infarcts. Patient is noted to be in atrial flutter, rate control. Her only current medication is aspirin and she does not have a family doctor. Given her medical comorbidities do feel that she is at risk for stroke and recommend transfer to [facility] for MRI to further evaluate. Daughter and patient declined transfer for further imaging and prefer to go home. They have PCP in mind and she can be seen in the next few days. UA is concerning for UTI and she was treated with antibiotics. Discussed importance of outpatient follow-up as well as return precautions.  Final Clinical Impressions(s) / ED Diagnoses   Final diagnoses:  Acute UTI  Dizziness    ED Discharge Orders         Ordered    cephALEXin (KEFLEX) 500 MG capsule  2 times daily     04/20/18 2132           Tilden Fossa, MD 04/20/18 2349

## 2018-04-20 NOTE — ED Notes (Signed)
Pt on monitor 

## 2018-04-20 NOTE — ED Triage Notes (Signed)
Dizziness today.

## 2018-04-20 NOTE — ED Notes (Signed)
Pt. Walked around the room with assistance of her daughter.  Pt. Reports no dizziness.

## 2018-04-23 LAB — URINE CULTURE: Culture: >=100000 — AB

## 2018-04-24 ENCOUNTER — Telehealth: Payer: Self-pay

## 2018-04-24 NOTE — Telephone Encounter (Signed)
Post ED Visit - Positive Culture Follow-up  Culture report reviewed by antimicrobial stewardship pharmacist:  []  Enzo BiNathan Batchelder, Pharm.D. []  Celedonio MiyamotoJeremy Frens, Pharm.D., BCPS AQ-ID []  Garvin FilaMike Maccia, Pharm.D., BCPS []  Georgina PillionElizabeth Martin, Pharm.D., BCPS []  Warren ParkMinh Pham, [address] BoulevardPharm.D., BCPS, AAHIVP []  Estella HuskMichelle Turner, Pharm.D., BCPS, AAHIVP [x]  Lysle Pearlachel Rumbarger, PharmD, BCPS []  Phillips Climeshuy Dang, PharmD, BCPS []  Agapito GamesAlison Masters, PharmD, BCPS []  Verlan FriendsErin Deja, PharmD  Positive urine culture Treated with Cephalexin, organism sensitive to the same and no further patient follow-up is required at this time.  Jerry CarasCullom, Audi Conover Burnett 04/24/2018, 10:14 AM

## 2018-05-20 DIAGNOSIS — N3000 Acute cystitis without hematuria: Secondary | ICD-10-CM | POA: Diagnosis not present

## 2018-06-16 DIAGNOSIS — L0889 Other specified local infections of the skin and subcutaneous tissue: Secondary | ICD-10-CM | POA: Diagnosis not present

## 2018-06-16 DIAGNOSIS — L309 Dermatitis, unspecified: Secondary | ICD-10-CM | POA: Diagnosis not present

## 2018-06-16 DIAGNOSIS — L0109 Other impetigo: Secondary | ICD-10-CM | POA: Diagnosis not present

## 2019-06-06 DIAGNOSIS — H02135 Senile ectropion of left lower eyelid: Secondary | ICD-10-CM | POA: Diagnosis not present

## 2019-06-06 DIAGNOSIS — B309 Viral conjunctivitis, unspecified: Secondary | ICD-10-CM | POA: Diagnosis not present

## 2019-06-13 DIAGNOSIS — H40051 Ocular hypertension, right eye: Secondary | ICD-10-CM | POA: Diagnosis not present

## 2019-06-13 DIAGNOSIS — H02135 Senile ectropion of left lower eyelid: Secondary | ICD-10-CM | POA: Diagnosis not present

## 2019-06-13 DIAGNOSIS — B309 Viral conjunctivitis, unspecified: Secondary | ICD-10-CM | POA: Diagnosis not present

## 2019-06-29 ENCOUNTER — Encounter (HOSPITAL_COMMUNITY): Payer: Self-pay

## 2019-06-29 ENCOUNTER — Other Ambulatory Visit: Payer: Self-pay

## 2019-06-29 ENCOUNTER — Emergency Department (HOSPITAL_COMMUNITY)
Admission: EM | Admit: 2019-06-29 | Discharge: 2019-06-29 | Disposition: A | Discharge status: Home / Self Care | Payer: Medicare Other | Attending: Emergency Medicine | Admitting: Emergency Medicine

## 2019-06-29 DIAGNOSIS — I442 Atrioventricular block, complete: Secondary | ICD-10-CM | POA: Diagnosis not present

## 2019-06-29 DIAGNOSIS — Z139 Encounter for screening, unspecified: Secondary | ICD-10-CM | POA: Diagnosis not present

## 2019-06-29 DIAGNOSIS — I10 Essential (primary) hypertension: Secondary | ICD-10-CM | POA: Diagnosis not present

## 2019-06-29 DIAGNOSIS — Z0000 Encounter for general adult medical examination without abnormal findings: Secondary | ICD-10-CM | POA: Diagnosis not present

## 2019-06-29 DIAGNOSIS — R627 Adult failure to thrive: Secondary | ICD-10-CM | POA: Diagnosis not present

## 2019-06-29 DIAGNOSIS — I4891 Unspecified atrial fibrillation: Secondary | ICD-10-CM | POA: Diagnosis not present

## 2019-06-29 DIAGNOSIS — Z7982 Long term (current) use of aspirin: Secondary | ICD-10-CM | POA: Diagnosis not present

## 2019-06-29 LAB — BASIC METABOLIC PANEL
Anion gap: 10 (ref 5–15)
BUN: 14 mg/dL (ref 8–23)
CO2: 25 mmol/L (ref 22–32)
Calcium: 8.1 mg/dL — ABNORMAL LOW (ref 8.9–10.3)
Chloride: 100 mmol/L (ref 98–111)
Creatinine, Ser: 0.7 mg/dL (ref 0.44–1.00)
GFR calc Af Amer: >60 mL/min (ref >60)
GFR calc non Af Amer: >60 mL/min (ref >60)
Glucose, Bld: 103 mg/dL — ABNORMAL HIGH (ref 70–99)
Potassium: 4 mmol/L (ref 3.5–5.1)
Sodium: 135 mmol/L (ref 135–145)

## 2019-06-29 LAB — CBC WITH DIFFERENTIAL/PLATELET
Abs Immature Granulocytes: 0.01 10*3/uL (ref 0.00–0.07)
Basophils Absolute: 0 10*3/uL (ref 0.0–0.1)
Basophils Relative: 1 %
Eosinophils Absolute: 0 10*3/uL (ref 0.0–0.5)
Eosinophils Relative: 1 %
HCT: 38 % (ref 36.0–46.0)
Hemoglobin: 12 g/dL (ref 12.0–15.0)
Immature Granulocytes: 0 %
Lymphocytes Relative: 32 %
Lymphs Abs: 1.4 10*3/uL (ref 0.7–4.0)
MCH: 31.8 pg (ref 26.0–34.0)
MCHC: 31.6 g/dL (ref 30.0–36.0)
MCV: 100.8 fL — ABNORMAL HIGH (ref 80.0–100.0)
Monocytes Absolute: 0.4 10*3/uL (ref 0.1–1.0)
Monocytes Relative: 9 %
Neutro Abs: 2.5 10*3/uL (ref 1.7–7.7)
Neutrophils Relative %: 57 %
Platelets: 238 10*3/uL (ref 150–400)
RBC: 3.77 MIL/uL — ABNORMAL LOW (ref 3.87–5.11)
RDW: 12.2 % (ref 11.5–15.5)
WBC: 4.3 10*3/uL (ref 4.0–10.5)
nRBC: 0 % (ref 0.0–0.2)

## 2019-06-29 NOTE — Progress Notes (Signed)
CSW called [facility] who will request that APS in [facility] call the CSW back.  CSW will continue to follow for D/C needs.  Alphonse Guild. Valerie Cones, LCSW, LCAS, CSI Transitions of Care Clinical Social Worker Care Coordination Department Ph: [phone]
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
## 2019-06-29 NOTE — ED Notes (Signed)
Patient has had Hx of stroke affecting the right side with patient being a baseline.

## 2019-06-29 NOTE — Progress Notes (Addendum)
CSW called and spoke to pt's daughter Janilah Hojnacki at ph: [phone] to update her pt was insisting on returning home and pt's daughter stated rather than involve [facility] she would prefer to pick the pt up.  Pt's daughter stated it would be approximately 30-40 minutes until she arrived (7:30pm).  RN updated.  CSW will continue to follow for D/C needs.  Alphonse Guild. Yurem Viner, LCSW, LCAS, CSI Transitions of Care Clinical Social Worker Care Coordination Department Ph: [phone]
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
## 2019-06-29 NOTE — Progress Notes (Addendum)
CSW received a call from [place] social worker who states she there is an open case and CSW updated them with new information.  DSS worker Angie Polito voiced understanding that pt is returning home due to pt's insistence she be D/C'd.  Per DSS, they received info from neighbor who is a Engineer, structural:  "Per their neighbot their toliet doesn't work, they have been emptying the bedside commode in the yard and they own over a 14 acres worth a lot of money that they can use to care for themselves, but aren't selling it because they don't "trust" the government."  [employer] updated.   CSW will continue to follow for D/C needs.  Sheila Byrd. Sheila Ahmed, LCSW, LCAS, CSI Transitions of Care Clinical Social Worker Care Coordination Department Ph: [phone]

## 2019-06-29 NOTE — ED Notes (Signed)
Security has been called to patient bedside due to patient daughter becoming upset about entire situation.
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
## 2019-06-29 NOTE — ED Notes (Signed)
Berline Lopes [phone] (niece)

## 2019-06-29 NOTE — ED Notes (Signed)
RN called primary residence to confirm that [facility] will be able to get in. RN spoke to patient daughter and daughter became upset and hung up on RN. RN is unsure if patient will be getting transported home by patient daughter. RN will not call [facility] off unless patient family member shows up.

## 2019-06-29 NOTE — ED Notes (Signed)
RN has just been informed by Sheila Byrd from Social Work that Daughter is in route to pick patient up. Patient informed Sheila Byrd that patient will be put in lobby until patient can be picked up due to EMS traffic and patients waiting in lobby.

## 2019-06-29 NOTE — ED Notes (Signed)
[facility] has been called for transport. 

## 2019-06-29 NOTE — Discharge Instructions (Signed)
Thank you for allowing me to care for you today. Please return to the emergency department if you have new or worsening symptoms.   

## 2020-10-18 ENCOUNTER — Encounter (HOSPITAL_COMMUNITY): Payer: Self-pay | Admitting: *Deleted

## 2020-10-18 ENCOUNTER — Other Ambulatory Visit: Payer: Self-pay

## 2020-10-18 ENCOUNTER — Emergency Department (HOSPITAL_COMMUNITY)
Admission: EM | Admit: 2020-10-18 | Discharge: 2020-10-21 | Disposition: A | Discharge status: Home / Self Care | Payer: Medicare Other | Attending: Emergency Medicine | Admitting: Emergency Medicine

## 2020-10-18 DIAGNOSIS — Z764 Other boarder to healthcare facility: Secondary | ICD-10-CM | POA: Diagnosis not present

## 2020-10-18 DIAGNOSIS — Z20822 Contact with and (suspected) exposure to covid-19: Secondary | ICD-10-CM | POA: Insufficient documentation

## 2020-10-18 DIAGNOSIS — R2681 Unsteadiness on feet: Secondary | ICD-10-CM | POA: Insufficient documentation

## 2020-10-18 DIAGNOSIS — Z7982 Long term (current) use of aspirin: Secondary | ICD-10-CM | POA: Insufficient documentation

## 2020-10-18 LAB — BASIC METABOLIC PANEL
Anion gap: 14 (ref 5–15)
BUN: 16 mg/dL (ref 8–23)
CO2: 21 mmol/L — ABNORMAL LOW (ref 22–32)
Calcium: 8.9 mg/dL (ref 8.9–10.3)
Chloride: 100 mmol/L (ref 98–111)
Creatinine, Ser: 1.05 mg/dL — ABNORMAL HIGH (ref 0.44–1.00)
GFR, Estimated: 50 mL/min — ABNORMAL LOW (ref >60)
Glucose, Bld: 141 mg/dL — ABNORMAL HIGH (ref 70–99)
Potassium: 4.9 mmol/L (ref 3.5–5.1)
Sodium: 135 mmol/L (ref 135–145)

## 2020-10-18 LAB — CBC
HCT: 38.2 % (ref 36.0–46.0)
Hemoglobin: 12 g/dL (ref 12.0–15.0)
MCH: 32.8 pg (ref 26.0–34.0)
MCHC: 31.4 g/dL (ref 30.0–36.0)
MCV: 104.4 fL — ABNORMAL HIGH (ref 80.0–100.0)
Platelets: 246 10*3/uL (ref 150–400)
RBC: 3.66 MIL/uL — ABNORMAL LOW (ref 3.87–5.11)
RDW: 13.6 % (ref 11.5–15.5)
WBC: 11.3 10*3/uL — ABNORMAL HIGH (ref 4.0–10.5)
nRBC: 0 % (ref 0.0–0.2)
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
## 2020-10-19 DIAGNOSIS — Z764 Other boarder to healthcare facility: Secondary | ICD-10-CM | POA: Diagnosis not present

## 2020-10-19 LAB — SARS CORONAVIRUS 2 (TAT 6-24 HRS): SARS Coronavirus 2: NEGATIVE

## 2020-10-19 MED ORDER — ASPIRIN EC 81 MG PO TBEC
81.0000 mg | DELAYED_RELEASE_TABLET | Freq: Every day | ORAL | Status: DC
  Administered 2020-10-19 – 2020-10-21 (×3): 81 mg via ORAL
  Filled 2020-10-19 (×3): qty 1

## 2020-10-19 MED ORDER — SODIUM CHLORIDE 0.9 % IV SOLN: Freq: Once | INTRAVENOUS | Status: AC

## 2020-10-19 NOTE — Progress Notes (Signed)
Pt has PASRR # [id number]

## 2020-10-19 NOTE — [place] FL2 (Signed)
  [place] MEDICAID FL2 LEVEL OF CARE SCREENING TOOL     IDENTIFICATION  Patient Name: Sheila Byrd Birthdate: [birth year] Sex: female Admission Date (Current Location): 10/18/2020  [facility] and [place] Number:  Producer, television/film/video and Address:  [facility], [address] [place], [place] [address]      Provider Number: [id number]  Attending Physician Name and Address:  Default, Provider, MD  Relative Name and Phone Number:  Jassica, Zazueta Daughter [phone]    Current Level of Care: Hospital Recommended Level of Care: Skilled Nursing Facility Prior Approval Number:    Date Approved/Denied:   PASRR Number: Pending  Discharge Plan: SNF    Current Diagnoses: Patient Active Problem List   Diagnosis Date Noted  . Left carotid stenosis 07/09/2016  . Dehydration   . Dizziness 07/07/2016    Orientation RESPIRATION BLADDER Height & Weight     Self,Situation,Place  Normal Continent Weight:   Height:     BEHAVIORAL SYMPTOMS/MOOD NEUROLOGICAL BOWEL NUTRITION STATUS      Continent Diet (heart healthy)  AMBULATORY STATUS COMMUNICATION OF NEEDS Skin   Extensive Assist Verbally Normal                       Personal Care Assistance Level of Assistance  Bathing,Feeding,Dressing Bathing Assistance: Limited assistance Feeding assistance: Independent Dressing Assistance: Limited assistance     Functional Limitations Info  Speech,Sight,Hearing Sight Info: Adequate Hearing Info: Adequate Speech Info: Adequate    SPECIAL CARE FACTORS FREQUENCY  PT (By licensed PT),OT (By licensed OT)     PT Frequency: 5x weekly OT Frequency: 5x weekly            Contractures Contractures Info: Not present    Additional Factors Info  Code Status Code Status Info: Full             Current Medications (10/19/2020):  This is the current hospital active medication list Current Facility-Administered Medications  Medication Dose Route Frequency  Provider Last Rate Last Admin  . 0.9 %  sodium chloride infusion   Intravenous Once Horton, Kristie M, DO      . aspirin EC tablet 81 mg  81 mg Oral Daily Carroll Sage, PA-C   81 mg at 10/19/20 1047   Current Outpatient Medications  Medication Sig Dispense Refill  . aspirin EC 81 MG EC tablet Take 1 tablet (81 mg total) by mouth daily. (Patient not taking: Reported on 10/19/2020)       Discharge Medications: Please see discharge summary for a list of discharge medications.  Relevant Imaging Results:  Relevant Lab Results:   Additional Information SSN# [id number]  Shella Spearing, LCSW

## 2020-10-19 NOTE — ED Provider Notes (Signed)
[facility] EMERGENCY DEPARTMENT Provider Note   CSN: [id number] Arrival date & time: 10/18/20  2242     History Chief Complaint  Patient presents with  . boarder    Sheila Byrd is a 87 y.o. female.  Patient's daughter, Sheila Byrd, is currently a patient at [facility].  This is this patient's primary caregiver.  There are no other siblings, children, friends or other people that can take care of the patient at this time so she was checked in for safe place to stay.  Patient has no complaints.  Per the daughter's report (I discussed the situation with her daughter with the patient's permission and the daughter is also her emergency contact) patient only takes aspirin daily.  The patient and the daughter both state that she has no current medical needs        Past Medical History:  Diagnosis Date  . A-fib (HCC)   . Cellulitis   . Dehydration   . Stroke [facility])     Patient Active Problem List   Diagnosis Date Noted  . Left carotid stenosis 07/09/2016  . Dehydration   . Dizziness 07/07/2016    Past Surgical History:  Procedure Laterality Date  . TONSILLECTOMY  1954     OB History   No obstetric history on file.     Family History  Problem Relation Age of Onset  . Stroke Mother   . Heart disease Father     Social History   Tobacco Use  . Smoking status: Never Smoker  . Smokeless tobacco: Never Used  Vaping Use  . Vaping Use: Never used  Substance Use Topics  . Alcohol use: No  . Drug use: No    Home Medications Prior to Admission medications   Medication Sig Start Date End Date Taking? Authorizing Provider  aspirin EC 81 MG EC tablet Take 1 tablet (81 mg total) by mouth daily. 07/09/16   Joseph Art, DO  cephALEXin (KEFLEX) 500 MG capsule Take 1 capsule (500 mg total) by mouth 2 (two) times daily. 04/20/18   Tilden Fossa, MD    Allergies    Patient has no known allergies.  Review of Systems   Review of Systems  All  other systems reviewed and are negative.   Physical Exam Updated Vital Signs BP (!) 131/94 (BP Location: Right Arm)   Pulse 84   Temp (!) 97.5 F (36.4 C) (Oral)   Resp 16   SpO2 100%   Physical Exam Vitals and nursing note reviewed.  Constitutional:      Appearance: She is well-developed and well-nourished.  HENT:     Head: Normocephalic and atraumatic.     Nose: No congestion or rhinorrhea.     Mouth/Throat:     Mouth: Mucous membranes are moist.     Pharynx: Oropharynx is clear.  Eyes:     Pupils: Pupils are equal, round, and reactive to light.     Comments: Sunken eyes  Cardiovascular:     Rate and Rhythm: Normal rate and regular rhythm.  Pulmonary:     Effort: No respiratory distress.     Breath sounds: No stridor.  Abdominal:     General: There is no distension.  Musculoskeletal:     Cervical back: Normal range of motion.  Skin:    General: Skin is warm and dry.     Coloration: Skin is not pale.     Findings: No erythema.  Neurological:     General:  No focal deficit present.     Mental Status: She is alert and oriented to person, place, and time.     ED Results / Procedures / Treatments   Labs (all labs ordered are listed, but only abnormal results are displayed) Labs Reviewed  CBC - Abnormal; Notable for the following components:      Result Value   WBC 11.3 (*)    RBC 3.66 (*)    MCV 104.4 (*)    All other components within normal limits  BASIC METABOLIC PANEL - Abnormal; Notable for the following components:   CO2 21 (*)    Glucose, Bld 141 (*)    Creatinine, Ser 1.05 (*)    GFR, Estimated 50 (*)    All other components within normal limits  SARS CORONAVIRUS 2 (TAT 6-24 HRS)  URINALYSIS, ROUTINE W REFLEX MICROSCOPIC    EKG None  Radiology No results found.  Procedures Procedures   Medications Ordered in ED Medications - No data to display  ED Course  I have reviewed the triage vital signs and the nursing notes.  Pertinent labs &  imaging results that were available during my care of the patient were reviewed by me and considered in my medical decision making (see chart for details).    MDM Rules/Calculators/A&P                          TOC team already working on placement, will continue same in AM.    Final Clinical Impression(s) / ED Diagnoses Final diagnoses:  None    Rx / DC Orders ED Discharge Orders    None       Gessica Jawad, Barbara Cower, MD 10/19/20 [phone]

## 2020-10-19 NOTE — Progress Notes (Signed)
CSW spoke with APS and they stated they would need to staff with a supervisor to determine next steps.

## 2020-10-19 NOTE — Progress Notes (Addendum)
CSW received a call from APS, Adrienne. He plans on initiating case and will speak with the family today.

## 2020-10-19 NOTE — ED Notes (Signed)
Dinner Tray Ordered @ 1711. 

## 2020-10-19 NOTE — Progress Notes (Addendum)
CSW contacted Grant Fontana. Ms. Libman did not answer. Unable to leave a message. Phone kept ringing.

## 2020-10-19 NOTE — Progress Notes (Addendum)
CSW spoke with APS, Ignacia Bayley. CSW looked up the daughter Lovell Roe and found out that she left AMA and did not take patient with her. Ms. Lohn thought her mother was going to a home. Ms. Fargo will need to come and pick up her mother. APS is unsure of what their next steps will be.

## 2020-10-19 NOTE — Progress Notes (Addendum)
  CSW sent referrals out via hub to several area SNFs  Per daughter the ONLY SNF she will accept is [facility].  Faxed out referral to [facility].   7:25 CSW spoke with daughter, Annice Pih via phone. Received permission to complete paperwork for SNF.

## 2020-10-19 NOTE — Evaluation (Signed)
Physical Therapy Evaluation Patient Details Name: Sheila Byrd MRN: [id number] DOB: 1926-09-20 Today's Date: 10/19/2020   History of Present Illness  Pt is a 87 y/o female presenting to the ED as her daughter was being admitted to the hospital and she had no other caregiver. HEr daughter has since left the hospital and pt remained. PMH includes CVA and a fib.  Clinical Impression  Pt presenting with problem above and deficits below. Pt presenting with cognitive deficits, weakness, and decreased balance. Pt requiring max A to stand at EOB, and demonstrated posterior lean. Pt also had a spike in HR when standing to 155. Notified RN. Pt required return to supine for HR to return to low 90s. Pt is at increased risk for falls and does not have necessary assist at d/c. Will continue to follow acutely.     Follow Up Recommendations SNF;Supervision/Assistance - 24 hour    Equipment Recommendations  Wheelchair (measurements PT);Wheelchair cushion (measurements PT);Hospital bed    Recommendations for Other Services       Precautions / Restrictions Precautions Precautions: Fall Restrictions Weight Bearing Restrictions: No      Mobility  Bed Mobility Overal bed mobility: Needs Assistance Bed Mobility: Supine to Sit;Sit to Supine     Supine to sit: Min assist Sit to supine: Mod assist   General bed mobility comments: Min A for trunk assist to come to sitting. Increased time required. Mod A for trunk and LE assist to return to supine.    Transfers Overall transfer level: Needs assistance Equipment used: None Transfers: Sit to/from Stand Sit to Stand: Max assist         General transfer comment: Max A for lift assist and steadying to stand. Pt with posterior lean and unable to take steps. HR spiked to 155 upon standing, so further mobility deferred.  Ambulation/Gait                Stairs            Wheelchair Mobility    Modified Rankin (Stroke Patients Only)        Balance Overall balance assessment: Needs assistance Sitting-balance support: No upper extremity supported;Feet supported Sitting balance-Leahy Scale: Fair   Postural control: Posterior lean Standing balance support: No upper extremity supported;During functional activity Standing balance-Leahy Scale: Poor Standing balance comment: max A to maintain standing                             Pertinent Vitals/Pain Pain Assessment: Faces Faces Pain Scale: No hurt    Home Living Family/patient expects to be discharged to:: Unsure                 Additional Comments: Pt reporting conflicting home information. initially reports she lives with her mother, however, per notes lives with her daughter.    Prior Function           Comments: Unsure of baseline function. Pt reports she was ambulating without AD, however, unsure of accuracy. Per notes, daughter was her caregiver, but did not specify to what extent.     Hand Dominance        Extremity/Trunk Assessment   Upper Extremity Assessment Upper Extremity Assessment: RUE deficits/detail RUE Deficits / Details: RUE weakness at baseline from previous CVA.    Lower Extremity Assessment Lower Extremity Assessment: Generalized weakness;LLE deficits/detail;RLE deficits/detail RLE Deficits / Details: Multiple bruises noted in BLE. LLE Deficits / Details: Multiple bruises noted in  BLE.    Cervical / Trunk Assessment Cervical / Trunk Assessment: Kyphotic  Communication   Communication: HOH  Cognition Arousal/Alertness: Awake/alert Behavior During Therapy: Flat affect Overall Cognitive Status: No family/caregiver present to determine baseline cognitive functioning                                 General Comments: Memory deficits noted. Could not remember her birthday and did not know the month or year. Decreased safety awareness noted as well.      General Comments      Exercises      Assessment/Plan    PT Assessment Patient needs continued PT services  PT Problem List Decreased strength;Decreased range of motion;Decreased activity tolerance;Decreased balance;Decreased mobility;Decreased coordination;Decreased cognition;Decreased knowledge of use of DME;Decreased safety awareness;Decreased knowledge of precautions       PT Treatment Interventions Gait training;DME instruction;Functional mobility training;Therapeutic activities;Therapeutic exercise;Balance training;Patient/family education    PT Goals (Current goals can be found in the Care Plan section)  Acute Rehab PT Goals PT Goal Formulation: Patient unable to participate in goal setting Time For Goal Achievement: 11/02/20 Potential to Achieve Goals: Fair    Frequency Min 2X/week   Barriers to discharge Decreased caregiver support      Co-evaluation               AM-PAC PT "6 Clicks" Mobility  Outcome Measure Help needed turning from your back to your side while in a flat bed without using bedrails?: A Little Help needed moving from lying on your back to sitting on the side of a flat bed without using bedrails?: A Little Help needed moving to and from a bed to a chair (including a wheelchair)?: A Lot Help needed standing up from a chair using your arms (e.g., wheelchair or bedside chair)?: A Lot Help needed to walk in hospital room?: Total Help needed climbing 3-5 steps with a railing? : Total 6 Click Score: 12    End of Session Equipment Utilized During Treatment: Gait belt Activity Tolerance: Treatment limited secondary to medical complications (Comment) (elevated HR) Patient left: in bed;with call bell/phone within reach (on stretcher in ED) Nurse Communication: Mobility status PT Visit Diagnosis: Unsteadiness on feet (R26.81);Muscle weakness (generalized) (M62.81);Difficulty in walking, not elsewhere classified (R26.2)    Time: 4332-9518 PT Time Calculation (min) (ACUTE ONLY): 14  min   Charges:   PT Evaluation $PT Eval Moderate Complexity: 1 Mod          Farley Ly, PT, DPT  Acute Rehabilitation Services  Pager: [phone] Office: [phone]   Lehman Prom 10/19/2020, 4:42 PM

## 2020-10-20 DIAGNOSIS — Z764 Other boarder to healthcare facility: Secondary | ICD-10-CM | POA: Diagnosis not present

## 2020-10-20 NOTE — ED Notes (Signed)
Report from Brittany, RN.

## 2020-10-20 NOTE — ED Notes (Signed)
Pt sleeping without distress. Resp even and non-labored. No complaints at this time. Will continue to monitor.  

## 2020-10-20 NOTE — ED Provider Notes (Signed)
Pt initially presents to hospital with report that dependent on daughter for home care/ADLS, etc, and that daughter was recently admitted to hospital.  Daughter recently checked self out of hospital, and had briefly stopped by ED to see mother, and indicating she could not longer provide care to her, and left hospital, and is unable to be contacted. Police indicate padlock on residence, and overall very poor conditions at residence. Pt is completely unable to live independently there even if able to access.   Discussed with TOC team - will pursue SNF placement  (APS has been notified as well) - patient is to be placed to first facility that is able to make bed offer for patient - patients information is being faxed out to all appropriate facilities.      Cathren Laine, MD 10/20/20 1326

## 2020-10-20 NOTE — Progress Notes (Signed)
Patient referred to SNF's further out to support discharge process for patient.

## 2020-10-20 NOTE — Care Management (Signed)
ED  RNCM made 2nd attempt no answer, contacted Sherriff Department to do a wellness check, received call back from deputy that daughter was not at home and there was a padlock on door.

## 2020-10-20 NOTE — ED Provider Notes (Signed)
Emergency Medicine Observation Re-evaluation Note  Sheila Byrd is a 87 y.o. female, seen on rounds today.  Pt initially presented to the ED for complaints of boarder Currently, the patient is sleeping, when aroused very pleasant.  Patient's daughter was recently admitted to [facility], patient was brought with daughter and daughter states that she needed to have mom be boarded while she was admitted since she was her primary caretaker.  Daughter left AMA yesterday and left her mom here, daughter wants her to be placed. APS involved.  Patient awaiting placement.  Physical Exam  BP (!) 116/57   Pulse 84   Temp 97.9 F (36.6 C) (Oral)   Resp 16   SpO2 98%  Physical Exam General: No acute distress Cardiac: Regular rate Lungs: Clear to auscultation Psych: No changes  ED Course / MDM  EKG:    I have reviewed the labs performed to date as well as medications administered while in observation.  Recent changes in the last 24 hours include none.  Plan  Current plan is for awaiting SNF placement. Patient is not under full IVC at this time.   Farrel Gordon, PA-C 10/20/20 3893    Pollyann Savoy, MD 10/20/20 [phone]

## 2020-10-20 NOTE — ED Notes (Signed)
Report to Erica, RN

## 2020-10-20 NOTE — ED Notes (Signed)
Pt's VS take x 2 , pt was sleeping.

## 2020-10-20 NOTE — Care Management (Addendum)
ED RNCM attempted to contact daughter Annice Pih [phone] to inform her that there are no available beds at this time at [facility], no answer.  CM will make second attempt in an hour.

## 2020-10-21 ENCOUNTER — Telehealth: Payer: Self-pay | Admitting: Surgery

## 2020-10-21 DIAGNOSIS — Z764 Other boarder to healthcare facility: Secondary | ICD-10-CM | POA: Diagnosis not present

## 2020-10-21 DIAGNOSIS — M2550 Pain in unspecified joint: Secondary | ICD-10-CM | POA: Diagnosis not present

## 2020-10-21 DIAGNOSIS — R4182 Altered mental status, unspecified: Secondary | ICD-10-CM | POA: Diagnosis not present

## 2020-10-21 DIAGNOSIS — Z7401 Bed confinement status: Secondary | ICD-10-CM | POA: Diagnosis not present

## 2020-10-21 MED ORDER — TRAZODONE HCL 50 MG PO TABS: 25.0000 mg | ORAL_TABLET | Freq: Every evening | ORAL | Status: DC | PRN

## 2020-10-21 MED ORDER — POLYETHYLENE GLYCOL 3350 17 G PO PACK: 17.0000 g | PACK | Freq: Every day | ORAL | Status: DC | PRN

## 2020-10-21 MED ORDER — ACETAMINOPHEN 325 MG PO TABS: 650.0000 mg | ORAL_TABLET | Freq: Four times a day (QID) | ORAL | Status: DC | PRN

## 2020-10-21 MED ORDER — ACETAMINOPHEN 650 MG RE SUPP: 650.0000 mg | Freq: Four times a day (QID) | RECTAL | Status: DC | PRN

## 2020-10-21 NOTE — Care Management (Signed)
ED RNCM  Reached patient's daughter Abigayl Hor by phone. Explained that we are not able to board her mother in the ED waiting on [facility] to have a bed.  Explained that Fl-2 were sent out to other facilities. She stated, She can't take care of her at this time, and provided verbal permission to send FL2 to other SNFs.  Explained that the CSW will contact her tomorrow with bed offers. TOC team will continue to to secure safe placement.

## 2020-10-21 NOTE — ED Notes (Signed)
Pt continuously calls out for "jackie". Tired to redirect pt however she continues to yell for her daughter.

## 2020-10-21 NOTE — ED Notes (Signed)
Pt dressed back in her original close, waiting for daughter to get here for d/c.

## 2020-10-21 NOTE — ED Notes (Signed)
Pt ate some breakfast.Keeps calling out for help. When asked what she needs pt states she doesn't need anything.

## 2020-10-21 NOTE — TOC Progression Note (Signed)
Transition of Care [facility]) - Progression Note    Patient Details  Name: Kalinda Romaniello MRN: [id number] Date of Birth: [birth year]  Transition of Care [facility]) CM/SW Contact  Annalee Genta, LCSW Phone Number: 10/21/2020, 3:37 PM  Clinical Narrative: CSW contacted by [facility] that patient's daughter was now involved and open to placement to other places. CSW spoke with daughter on phone and noted she believed it would only be temporary until the hospital got her to [place]. CSW explained CSW and ED role and placement limitations. CSW informed patient of the current bed offers. CSW noted daughter was concerned with the current options and was informed a plan is needed as she remains in the ED. CSW noted request to discharge patient back home with home health and she will look for an aid for her mother too. CSW notes APS involvement. CSW inquired address and noted that the [address] was just a mailing address and their actual address of residence is [address]. CSW informed RNCM and notes that while it's not ideal for patient to return this situation will likely require APS intervention for long-term support. At this time next of kin is requesting DC versus the available options.     Expected Discharge Plan: Skilled Nursing Facility Barriers to Discharge: No Barriers Identified  Expected Discharge Plan and Services Expected Discharge Plan: Skilled Nursing Facility       Living arrangements for the past 2 months: Single Family Home                                       Social Determinants of Health (SDOH) Interventions    Readmission Risk Interventions No flowsheet data found.

## 2020-10-21 NOTE — ED Provider Notes (Signed)
Emergency Medicine Observation Re-evaluation Note  Sheila Byrd is a 87 y.o. female, seen on rounds today.  Pt initially presented to the ED for complaints of boarder  Patient's daughter was recently admitted to [facility], patient was brought with daughter and daughter states that she needed to have mom be boarded while she was admitted since she was her primary caretaker.  Daughter left AMA yesterday and left her mom here, daughter wants her to be placed. APS involved.  Patient awaiting placement.  Currently, the patient is resting comfortably in bed.  In no acute distress.Marland Kitchen  Physical Exam  BP (!) 128/57 (BP Location: Left Arm)   Pulse 96   Temp 98.5 F (36.9 C) (Oral)   Resp 16   SpO2 97%  CONSTITUTIONAL:  well-appearing, NAD NEURO:  Alert and oriented x 3, no focal deficits EYES:  pupils equal and reactive ENT/NECK:  trachea midline, no JVD CARDIO:  reg rate, reg rhythm, well-perfused PULM:  None labored breathing GI/GU:  Abdomen non-distended MSK/SPINE:  No gross deformities, no edema SKIN:  no rash obvious, atraumatic, no ecchymosis  PSYCH:  Appropriate speech and behavior  ED Course / MDM  EKG:    I have reviewed the labs performed to date as well as medications administered while in observation.  Recent changes in the last 24 hours include no change.  Plan  Current plan is for SNF placement.. Patient is not under full IVC at this time.  Patient is currently, it has been agitated during the evenings.  We will put in as needed trazodone order as well as as needed Tylenol for pain.   Solon Augusta [place], [place] 10/21/20 1434    Linwood Dibbles, MD 10/23/20 [phone]

## 2020-10-21 NOTE — Care Management (Signed)
ED RNCM received call from ED CSW concerning change in transitional care plan.  Patient daughter now does not want patient to go to the SNF which made bed offer, she now wants patient to return home as per CSW. She was offered [facility] services no preference. Referral called in to [facility].  Daughter agreeable with transport by [facility]. ED RNCM updated EDP and RN on [facility].

## 2020-10-21 NOTE — ED Notes (Signed)
Pt on phone with daughter

## 2020-10-21 NOTE — Discharge Instructions (Signed)
You were brought to the emergency room for concerns that your primary caregiver was unable to meet your care needs.  The social work team has been working on placement to a skilled nursing facility.  Placement at a skilled nursing facility is still pending.  In the meantime you are medically stable for discharge.  Home health care RN, physical therapy, and occasional therapy have been contacted to help with your care.  Please return to the emerge department if you have any new symptoms or concerning symptoms.

## 2020-10-21 NOTE — ED Notes (Signed)
Per caseworker she will Nurse, learning disability for [facility] transport.

## 2020-10-21 NOTE — Care Management (Signed)
ED RNCM attempted to contact daughter Sheila Byrd by phone [phone]. TOC team was made aware that daughter stopped in briefly yesterday but did not stay.  TOC team made staff aware that if she should show up to contact us. Placement is being sought at several SNFs,

## 2020-10-25 ENCOUNTER — Emergency Department (HOSPITAL_COMMUNITY): Payer: Medicare Other

## 2020-10-25 ENCOUNTER — Encounter (HOSPITAL_COMMUNITY): Payer: Self-pay | Admitting: Radiology

## 2020-10-25 ENCOUNTER — Emergency Department (HOSPITAL_COMMUNITY)
Admission: EM | Admit: 2020-10-25 | Discharge: 2020-10-27 | Disposition: A | Discharge status: Home / Self Care | Payer: Medicare Other | Attending: Emergency Medicine | Admitting: Emergency Medicine

## 2020-10-25 DIAGNOSIS — M6281 Muscle weakness (generalized): Secondary | ICD-10-CM | POA: Insufficient documentation

## 2020-10-25 DIAGNOSIS — F0150 Vascular dementia without behavioral disturbance: Secondary | ICD-10-CM | POA: Diagnosis present

## 2020-10-25 DIAGNOSIS — G9389 Other specified disorders of brain: Secondary | ICD-10-CM | POA: Diagnosis not present

## 2020-10-25 DIAGNOSIS — R531 Weakness: Secondary | ICD-10-CM | POA: Insufficient documentation

## 2020-10-25 DIAGNOSIS — R410 Disorientation, unspecified: Secondary | ICD-10-CM | POA: Insufficient documentation

## 2020-10-25 DIAGNOSIS — I4891 Unspecified atrial fibrillation: Secondary | ICD-10-CM | POA: Diagnosis not present

## 2020-10-25 DIAGNOSIS — I6529 Occlusion and stenosis of unspecified carotid artery: Secondary | ICD-10-CM | POA: Diagnosis not present

## 2020-10-25 DIAGNOSIS — T7401XA Adult neglect or abandonment, confirmed, initial encounter: Secondary | ICD-10-CM | POA: Diagnosis not present

## 2020-10-25 DIAGNOSIS — Z20822 Contact with and (suspected) exposure to covid-19: Secondary | ICD-10-CM | POA: Diagnosis not present

## 2020-10-25 DIAGNOSIS — R404 Transient alteration of awareness: Secondary | ICD-10-CM | POA: Diagnosis not present

## 2020-10-25 DIAGNOSIS — M2578 Osteophyte, vertebrae: Secondary | ICD-10-CM | POA: Diagnosis not present

## 2020-10-25 DIAGNOSIS — Z7982 Long term (current) use of aspirin: Secondary | ICD-10-CM | POA: Insufficient documentation

## 2020-10-25 DIAGNOSIS — S199XXA Unspecified injury of neck, initial encounter: Secondary | ICD-10-CM | POA: Diagnosis not present

## 2020-10-25 DIAGNOSIS — R5381 Other malaise: Secondary | ICD-10-CM | POA: Diagnosis present

## 2020-10-25 DIAGNOSIS — R2681 Unsteadiness on feet: Secondary | ICD-10-CM | POA: Insufficient documentation

## 2020-10-25 DIAGNOSIS — E860 Dehydration: Secondary | ICD-10-CM | POA: Diagnosis not present

## 2020-10-25 DIAGNOSIS — M4312 Spondylolisthesis, cervical region: Secondary | ICD-10-CM | POA: Diagnosis not present

## 2020-10-25 DIAGNOSIS — W19XXXA Unspecified fall, initial encounter: Secondary | ICD-10-CM | POA: Diagnosis not present

## 2020-10-25 DIAGNOSIS — Z0289 Encounter for other administrative examinations: Secondary | ICD-10-CM | POA: Diagnosis not present

## 2020-10-25 DIAGNOSIS — S0990XA Unspecified injury of head, initial encounter: Secondary | ICD-10-CM | POA: Diagnosis not present

## 2020-10-25 LAB — URINALYSIS, ROUTINE W REFLEX MICROSCOPIC
Bilirubin Urine: NEGATIVE
Glucose, UA: NEGATIVE mg/dL
Hgb urine dipstick: NEGATIVE
Ketones, ur: 20 mg/dL — AB
Leukocytes,Ua: NEGATIVE
Nitrite: NEGATIVE
Protein, ur: NEGATIVE mg/dL
Specific Gravity, Urine: 1.02 (ref 1.005–1.030)
pH: 5 (ref 5.0–8.0)

## 2020-10-25 LAB — CBC WITH DIFFERENTIAL/PLATELET
Abs Immature Granulocytes: 0.02 10*3/uL (ref 0.00–0.07)
Basophils Absolute: 0 10*3/uL (ref 0.0–0.1)
Basophils Relative: 1 %
Eosinophils Absolute: 0.1 10*3/uL (ref 0.0–0.5)
Eosinophils Relative: 3 %
HCT: 37 % (ref 36.0–46.0)
Hemoglobin: 12.2 g/dL (ref 12.0–15.0)
Immature Granulocytes: 0 %
Lymphocytes Relative: 14 %
Lymphs Abs: 0.8 10*3/uL (ref 0.7–4.0)
MCH: 34.1 pg — ABNORMAL HIGH (ref 26.0–34.0)
MCHC: 33 g/dL (ref 30.0–36.0)
MCV: 103.4 fL — ABNORMAL HIGH (ref 80.0–100.0)
Monocytes Absolute: 0.5 10*3/uL (ref 0.1–1.0)
Monocytes Relative: 9 %
Neutro Abs: 4.1 10*3/uL (ref 1.7–7.7)
Neutrophils Relative %: 73 %
Platelets: 194 10*3/uL (ref 150–400)
RBC: 3.58 MIL/uL — ABNORMAL LOW (ref 3.87–5.11)
RDW: 13.4 % (ref 11.5–15.5)
WBC: 5.6 10*3/uL (ref 4.0–10.5)
nRBC: 0 % (ref 0.0–0.2)

## 2020-10-25 LAB — COMPREHENSIVE METABOLIC PANEL
ALT: 12 U/L (ref 0–44)
AST: 25 U/L (ref 15–41)
Albumin: 3.3 g/dL — ABNORMAL LOW (ref 3.5–5.0)
Alkaline Phosphatase: 75 U/L (ref 38–126)
Anion gap: 8 (ref 5–15)
BUN: 19 mg/dL (ref 8–23)
CO2: 26 mmol/L (ref 22–32)
Calcium: 8.3 mg/dL — ABNORMAL LOW (ref 8.9–10.3)
Chloride: 102 mmol/L (ref 98–111)
Creatinine, Ser: 1 mg/dL (ref 0.44–1.00)
GFR, Estimated: 53 mL/min — ABNORMAL LOW (ref >60)
Glucose, Bld: 104 mg/dL — ABNORMAL HIGH (ref 70–99)
Potassium: 5.8 mmol/L — ABNORMAL HIGH (ref 3.5–5.1)
Sodium: 136 mmol/L (ref 135–145)
Total Bilirubin: 1.5 mg/dL — ABNORMAL HIGH (ref 0.3–1.2)
Total Protein: 6.6 g/dL (ref 6.5–8.1)

## 2020-10-25 MED ORDER — SODIUM ZIRCONIUM CYCLOSILICATE 10 G PO PACK
10.0000 g | PACK | Freq: Once | ORAL | Status: AC
  Administered 2020-10-25: 10 g via ORAL
  Filled 2020-10-25: qty 1

## 2020-10-25 MED ORDER — SODIUM CHLORIDE 0.9 % IV BOLUS
1000.0000 mL | Freq: Once | INTRAVENOUS | Status: AC
  Administered 2020-10-25: 1000 mL via INTRAVENOUS

## 2020-10-25 NOTE — ED Triage Notes (Signed)
Pt came in from a neighbor's house with c/o weakness. Pt was seen here last week after APS was called due to daughter abandoning pt. Pt fell yesterday at her own home, and denies pain or LOC. Neighbor went over to check on pt and found her on the floor. Neighbor took pt back to her home, and she has been caring for this pt at her home. Neighbor told EMS that pt has been having worsening weakness and has not been eating much. 200cc NS administered via EMS. Pt's skin and mucous membranes dry.

## 2020-10-25 NOTE — ED Notes (Signed)
Attempted to obtain orthostatic VS. Pt could only tolerate standing for a short time with an assist. B/Ps obtained and pt placed back in bed

## 2020-10-25 NOTE — ED Notes (Signed)
Pt's neighbor Opt[facility] [place]) [phone]

## 2020-10-25 NOTE — ED Provider Notes (Signed)
[facility]-EMERGENCY DEPT Provider Note   CSN: [id number] Arrival date & time: 10/25/20  2000     History Chief Complaint  Patient presents with  . Weakness    Sheila Byrd is a 87 y.o. female.  With a history of A. fib, stroke, cellulitis, dehydration, who presents via EMS for evaluation of weakness.  EMS was called by the patient's neighbor after she went to pick the patient up to take her for a primary care appointment she had help schedule for the patient.  She found that the patient was much weaker than usual having to be carried with assistance to the car.  Patient's neighbor, Anitra LauthCherrie Geoghegan, reports the patient was found alone in filthy living conditions, the patient was dirty and had nothing to eat or drink that she could get to.  Neighbor reports that she took the patient to the appointment to try and get an FL to figure out.  She has been trying to help get the patient into a nursing facility.  The patient's daughter, Grant FontanaJackie Hickel has been her primary caregiver and who the patient currently lives with, but there is currently an ongoing APS investigation after the patient was banded at the emergency department by her daughter last week.  They had attempted to set up SNF placement, but once the patient had been accepted at a facility the patient daughter declined this and instead decided to take her home with home health care, but the patient reports there has not been any home health care in the home thus far.  The neighbor reports after the appointment she was afraid to take her back to her home, because she was concerned that she was so weak and dehydrated and that she would die if left there.  She decided to take her back to her home and called EMS.  She reports that she had not seen the patient in about a week but reports that she seemed much weaker than her baseline today and was much less verbally responsive than usual.  Neighbor also reports that the patient  fell yesterday while she was walking out to the car with her daughter and was left on the ground for 20 minutes while the daughter waited for someone to help get her up.  The patient denies any pain or injury from this fall.  Patient does not seem to remember what happened today and does not know how she got to the hospital or who the neighbor was that was helping her.  Denies any focal at this time.          Past Medical History:  Diagnosis Date  . A-fib (HCC)   . Cellulitis   . Dehydration   . Stroke [facility](HCC)     Patient Active Problem List   Diagnosis Date Noted  . Left carotid stenosis 07/09/2016  . Dehydration   . Dizziness 07/07/2016    Past Surgical History:  Procedure Laterality Date  . TONSILLECTOMY  1954     OB History   No obstetric history on file.     Family History  Problem Relation Age of Onset  . Stroke Mother   . Heart disease Father     Social History   Tobacco Use  . Smoking status: Never Smoker  . Smokeless tobacco: Never Used  Vaping Use  . Vaping Use: Never used  Substance Use Topics  . Alcohol use: No  . Drug use: No    Home Medications Prior to Admission  medications   Medication Sig Start Date End Date Taking? Authorizing Provider  aspirin EC 81 MG EC tablet Take 1 tablet (81 mg total) by mouth daily. Patient not taking: Reported on 10/19/2020 07/09/16   Joseph Art, DO    Allergies    Patient has no known allergies.  Review of Systems   Review of Systems  Constitutional: Negative for chills and fever.  HENT: Negative.   Respiratory: Negative for cough and shortness of breath.   Cardiovascular: Negative for chest pain.  Gastrointestinal: Negative for abdominal pain.  Genitourinary: Negative for dysuria.  Musculoskeletal: Negative for arthralgias, back pain, myalgias and neck pain.  Skin: Negative for color change and wound.  Neurological: Positive for weakness. Negative for headaches.  All other systems reviewed and are  negative.   Physical Exam Updated Vital Signs BP (!) 144/82   Pulse 71   Temp (!) 97.4 F (36.3 C) (Oral)   Resp 16   Ht 5\' 1"  (1.549 m)   Wt 40.8 kg   SpO2 100%   BMI 17.01 kg/m   Physical Exam Vitals and nursing note reviewed.  Constitutional:      General: She is not in acute distress.    Appearance: She is well-developed and well-nourished. She is not diaphoretic.     Comments: Patient is alert, oriented to person and place, but somewhat confused.  She is very cachectic chronically ill-appearing  HENT:     Head: Normocephalic and atraumatic.     Comments: No palpable step-off, hematoma or deformity, negative battle sign    Mouth/Throat:     Mouth: Oropharynx is clear and moist. Mucous membranes are dry.     Comments: Membranes dry, numerous teeth missing Eyes:     General:        Right eye: No discharge.        Left eye: No discharge.     Extraocular Movements: Extraocular movements intact and EOM normal.     Pupils: Pupils are equal, round, and reactive to light.  Neck:     Comments: No midline C-spine tenderness, normal range of motion Cardiovascular:     Rate and Rhythm: Normal rate. Rhythm irregular.     Pulses: Intact distal pulses.     Heart sounds: Normal heart sounds. No murmur heard. No friction rub. No gallop.      Comments: A. fib, rate controlled Pulmonary:     Effort: Pulmonary effort is normal. No respiratory distress.     Breath sounds: Normal breath sounds. No wheezing or rales.     Comments: Respirations equal and unlabored, patient able to speak in full sentences, lungs clear to auscultation bilaterally, chest wall nontender to palpation Abdominal:     General: Bowel sounds are normal. There is no distension.     Palpations: Abdomen is soft. There is no mass.     Tenderness: There is no abdominal tenderness. There is no guarding.     Comments: Abdomen soft, nondistended, nontender to palpation in all quadrants without guarding or peritoneal  signs   Musculoskeletal:        General: No deformity or edema.     Cervical back: Neck supple.  Skin:    General: Skin is warm and dry.     Capillary Refill: Capillary refill takes less than 2 seconds.     Coloration: Skin is pale.     Comments: Skin is extremely dry with flaking  Neurological:     Mental Status: She is alert.  Coordination: Coordination normal.     Comments: Voice is hoarse and quiet, able to follow commands CN III-XII intact Normal strength in upper and lower extremities bilaterally including dorsiflexion and plantar flexion, strong and equal grip strength Sensation normal to light and sharp touch Moves extremities without ataxia, coordination intact  Psychiatric:        Mood and Affect: Mood normal. Affect is flat.        Speech: Speech normal.        Behavior: Behavior is withdrawn.     ED Results / Procedures / Treatments   Labs (all labs ordered are listed, but only abnormal results are displayed) Labs Reviewed  COMPREHENSIVE METABOLIC PANEL - Abnormal; Notable for the following components:      Result Value   Potassium 5.8 (*)    Glucose, Bld 104 (*)    Calcium 8.3 (*)    Albumin 3.3 (*)    Total Bilirubin 1.5 (*)    GFR, Estimated 53 (*)    All other components within normal limits  CBC WITH DIFFERENTIAL/PLATELET - Abnormal; Notable for the following components:   RBC 3.58 (*)    MCV 103.4 (*)    MCH 34.1 (*)    All other components within normal limits  URINALYSIS, ROUTINE W REFLEX MICROSCOPIC    EKG EKG Interpretation  Date/Time:  Thursday October 25 2020 20:15:12 EST Ventricular Rate:  59 PR Interval:    QRS Duration: 96 QT Interval:  413 QTC Calculation: 406 R Axis:   -10 Text Interpretation: Atrial fibrillation Artifact in lead(s) I II aVR aVL V1 No significant change since last tracing Confirmed by Susy Frizzle [phone]) on 10/25/2020 9:27:07 PM   Radiology CT Head Wo Contrast  Result Date: 10/25/2020 CLINICAL DATA:  Head  trauma, post fall EXAM: CT HEAD WITHOUT CONTRAST TECHNIQUE: Contiguous axial images were obtained from the base of the skull through the vertex without intravenous contrast. COMPARISON:  None. FINDINGS: Brain: No evidence of acute territorial infarction, hemorrhage, hydrocephalus,extra-axial collection or mass lesion/mass effect. There is dilatation the ventricles and sulci consistent with age-related atrophy. Low-attenuation changes in the deep white matter consistent with small vessel ischemia. Area of encephalomalacia involving the right superior parietal lobe. Vascular: No hyperdense vessel or unexpected calcification. Skull: The skull is intact. No fracture or focal lesion identified. Sinuses/Orbits: The visualized paranasal sinuses and mastoid air cells are clear. The orbits and globes intact. Other: None Cervical spine: Alignment: There is a mild rightward curvature of the cervical spine with slight reversal of the normal cervical lordosis. A minimal anterolisthesis of C4 on C5 is seen measuring 2 mm. Skull base and vertebrae: Visualized skull base is intact. No atlanto-occipital dissociation. The vertebral body heights are well maintained. No fracture or pathologic osseous lesion seen. Soft tissues and spinal canal: The visualized paraspinal soft tissues are unremarkable. No prevertebral soft tissue swelling is seen. The spinal canal is grossly unremarkable, no large epidural collection or significant canal narrowing. Disc levels: Multilevel cervical spine spondylosis is seen with disc osteophyte complex and uncovertebral osteophytes most notable at C5-C6 and C6-C7 with severe neural foraminal narrowing and mild central canal stenosis. Upper chest: The lung apices are clear. Carotid artery calcifications are seen. Thoracic inlet is within normal limits. Other: None IMPRESSION: No acute intracranial abnormality. Findings consistent with age related atrophy and chronic small vessel ischemia Area of  encephalomalacia involving the left parietal lobe No acute fracture or malalignment of the spine. Electronically Signed   By: Kandis Fantasia  Avutu M.D.   On: 10/25/2020 21:59   CT Cervical Spine Wo Contrast  Result Date: 10/25/2020 CLINICAL DATA:  Head trauma, post fall EXAM: CT HEAD WITHOUT CONTRAST TECHNIQUE: Contiguous axial images were obtained from the base of the skull through the vertex without intravenous contrast. COMPARISON:  None. FINDINGS: Brain: No evidence of acute territorial infarction, hemorrhage, hydrocephalus,extra-axial collection or mass lesion/mass effect. There is dilatation the ventricles and sulci consistent with age-related atrophy. Low-attenuation changes in the deep white matter consistent with small vessel ischemia. Area of encephalomalacia involving the right superior parietal lobe. Vascular: No hyperdense vessel or unexpected calcification. Skull: The skull is intact. No fracture or focal lesion identified. Sinuses/Orbits: The visualized paranasal sinuses and mastoid air cells are clear. The orbits and globes intact. Other: None Cervical spine: Alignment: There is a mild rightward curvature of the cervical spine with slight reversal of the normal cervical lordosis. A minimal anterolisthesis of C4 on C5 is seen measuring 2 mm. Skull base and vertebrae: Visualized skull base is intact. No atlanto-occipital dissociation. The vertebral body heights are well maintained. No fracture or pathologic osseous lesion seen. Soft tissues and spinal canal: The visualized paraspinal soft tissues are unremarkable. No prevertebral soft tissue swelling is seen. The spinal canal is grossly unremarkable, no large epidural collection or significant canal narrowing. Disc levels: Multilevel cervical spine spondylosis is seen with disc osteophyte complex and uncovertebral osteophytes most notable at C5-C6 and C6-C7 with severe neural foraminal narrowing and mild central canal stenosis. Upper chest: The lung apices  are clear. Carotid artery calcifications are seen. Thoracic inlet is within normal limits. Other: None IMPRESSION: No acute intracranial abnormality. Findings consistent with age related atrophy and chronic small vessel ischemia Area of encephalomalacia involving the left parietal lobe No acute fracture or malalignment of the spine. Electronically Signed   By: Jonna Clark M.D.   On: 10/25/2020 21:59   DG Chest Port 1 View  Result Date: 10/25/2020 CLINICAL DATA:  Fall, weakness EXAM: PORTABLE CHEST 1 VIEW COMPARISON:  July 07, 2016 FINDINGS: The heart size and mediastinal contours are within normal limits. Aortic knob calcifications are seen. Both lungs are clear. The visualized skeletal structures are unremarkable. IMPRESSION: No active disease. Electronically Signed   By: Jonna Clark M.D.   On: 10/25/2020 21:48    Procedures Procedures   Medications Ordered in ED Medications  acetaminophen (TYLENOL) tablet 650 mg (has no administration in time range)  sodium chloride 0.9 % bolus 1,000 mL (0 mLs Intravenous Stopped 10/25/20 2322)  sodium zirconium cyclosilicate (LOKELMA) packet 10 g (10 g Oral Given 10/25/20 2237)    ED Course  I have reviewed the triage vital signs and the nursing notes.  Pertinent labs & imaging results that were available during my care of the patient were reviewed by me and considered in my medical decision making (see chart for details).    MDM Rules/Calculators/A&P                         87 year old female arrives via EMS from nurse's house for generalized weakness.  Patient has been under the care of her daughter, was recently in the emergency department last week after her daughter brought her there while she was admitted to the hospital and then left the hospital without picking up her mother.  There is an ongoing APS investigation.  They attempted to find SNF placement, but daughter at the last moment decided to take her home instead.  Since then daughter has not  been caring much for her mother.  She has not been giving her much to eat or drink because then she has to change her.  Per neighbor she found the patient in an unsanitary environment without anything near her to eat or drink.  Patient was weaker than usual and not as verbally responsive, she was taking her to a doctor's appointment to try and get an FL2, she was unsure the impression that they had not been able to find placement for her when she was recently in the ED and had been trying to help with this.  She decided not to take the patient back to her home and called EMS due to concerns about her living environment and that it would not be safe for her to return home.  Will evaluate with basic labs, urinalysis, will also check CT of the head, C-spine and chest x-ray given reported fall yesterday.  I have independently ordered, reviewed and interpreted all labs and imaging: CBC: No leukocytosis, normal hemoglobin CMP: Potassium of 5.8.  Creatinine of 1.00, increased slightly from her baseline, normal BUN surprisingly despite significantly dehydrated appearance on exam.  No other significant electrolyte derangements, mildly elevated bili, LFTs otherwise unremarkable.  EKG with A. fib, rate controlled.  CT of the head and cervical spine without acute intracranial abnormality or signs of acute traumatic fracture or malalignment.  Chest x-ray with no active cardiopulmonary disease.  Patient with hyperkalemia, likely in the setting of dehydration.  Patient given IV fluids and Lokelma.  Patient also with unfortunate social situation, unsafe to return home.  Medicine consulted for admission for hyperkalemia.  Case discussed with Dr. Julian Reil, who is concerned that hyperkalemia may be hemolysis, although this was not documented on patient's labs, versus lab error, recommends repeating BMP.  After reviewing patient's notes from recent evaluation in the ED feels that patient's functional status and mental  status are at baseline.  If potassium is normal on recheck does not feel that patient will need to be admitted.  On recheck patient's potassium is within normal limits, she does have mild hypocalcemia.  Dr. Julian Reil has seen the patient in consult but does not feel that patient meets admission criteria.  Patient will remain in the ED overnight pending social work evaluation in the morning.  Final Clinical Impression(s) / ED Diagnoses Final diagnoses:  Weakness  Dehydration    Rx / DC Orders ED Discharge Orders    None       Legrand Rams 10/26/20 0114    Pollyann Savoy, MD 10/29/20 1010

## 2020-10-25 NOTE — ED Notes (Addendum)
Orthostatic vitals obtained. Pt noted to be very unsteady on her feet once standing.Standing portion achieved with 2 person assist.

## 2020-10-25 NOTE — Clinical Social Work Note (Signed)
CSW received call from APS caseworker stating that pt would be arriving to ED. A. Carmicheal is the APS SW for pt. She is aware pt is at ED and will f/u with TOC in the morning about the case.

## 2020-10-25 NOTE — ED Provider Notes (Incomplete)
[facility]-EMERGENCY DEPT Provider Note   CSN: [id number] Arrival date & time: 10/25/20  2000     History Chief Complaint  Patient presents with  . Weakness    Sheila Byrd is a 87 y.o. female.  HPI     Past Medical History:  Diagnosis Date  . A-fib (HCC)   . Cellulitis   . Dehydration   . Stroke [facility])     Patient Active Problem List   Diagnosis Date Noted  . Left carotid stenosis 07/09/2016  . Dehydration   . Dizziness 07/07/2016    Past Surgical History:  Procedure Laterality Date  . TONSILLECTOMY  1954     OB History   No obstetric history on file.     Family History  Problem Relation Age of Onset  . Stroke Mother   . Heart disease Father     Social History   Tobacco Use  . Smoking status: Never Smoker  . Smokeless tobacco: Never Used  Vaping Use  . Vaping Use: Never used  Substance Use Topics  . Alcohol use: No  . Drug use: No    Home Medications Prior to Admission medications   Medication Sig Start Date End Date Taking? Authorizing Provider  aspirin EC 81 MG EC tablet Take 1 tablet (81 mg total) by mouth daily. Patient not taking: Reported on 10/19/2020 07/09/16   Joseph Art, DO    Allergies    Patient has no known allergies.  Review of Systems   Review of Systems  Physical Exam Updated Vital Signs BP (!) 144/82   Pulse 71   Temp (!) 97.4 F (36.3 C) (Oral)   Resp 16   Ht 5\' 1"  (1.549 m)   Wt 40.8 kg   SpO2 100%   BMI 17.01 kg/m   Physical Exam  ED Results / Procedures / Treatments   Labs (all labs ordered are listed, but only abnormal results are displayed) Labs Reviewed  COMPREHENSIVE METABOLIC PANEL - Abnormal; Notable for the following components:      Result Value   Potassium 5.8 (*)    Glucose, Bld 104 (*)    Calcium 8.3 (*)    Albumin 3.3 (*)    Total Bilirubin 1.5 (*)    GFR, Estimated 53 (*)    All other components within normal limits  CBC WITH DIFFERENTIAL/PLATELET -  Abnormal; Notable for the following components:   RBC 3.58 (*)    MCV 103.4 (*)    MCH 34.1 (*)    All other components within normal limits  URINALYSIS, ROUTINE W REFLEX MICROSCOPIC    EKG EKG Interpretation  Date/Time:  Thursday October 25 2020 20:15:12 EST Ventricular Rate:  59 PR Interval:    QRS Duration: 96 QT Interval:  413 QTC Calculation: 406 R Axis:   -10 Text Interpretation: Atrial fibrillation Artifact in lead(s) I II aVR aVL V1 No significant change since last tracing Confirmed by 12-25-1979 [phone]) on 10/25/2020 9:27:07 PM   Radiology CT Head Wo Contrast  Result Date: 10/25/2020 CLINICAL DATA:  Head trauma, post fall EXAM: CT HEAD WITHOUT CONTRAST TECHNIQUE: Contiguous axial images were obtained from the base of the skull through the vertex without intravenous contrast. COMPARISON:  None. FINDINGS: Brain: No evidence of acute territorial infarction, hemorrhage, hydrocephalus,extra-axial collection or mass lesion/mass effect. There is dilatation the ventricles and sulci consistent with age-related atrophy. Low-attenuation changes in the deep white matter consistent with small vessel ischemia. Area of encephalomalacia involving the right  superior parietal lobe. Vascular: No hyperdense vessel or unexpected calcification. Skull: The skull is intact. No fracture or focal lesion identified. Sinuses/Orbits: The visualized paranasal sinuses and mastoid air cells are clear. The orbits and globes intact. Other: None Cervical spine: Alignment: There is a mild rightward curvature of the cervical spine with slight reversal of the normal cervical lordosis. A minimal anterolisthesis of C4 on C5 is seen measuring 2 mm. Skull base and vertebrae: Visualized skull base is intact. No atlanto-occipital dissociation. The vertebral body heights are well maintained. No fracture or pathologic osseous lesion seen. Soft tissues and spinal canal: The visualized paraspinal soft tissues are unremarkable. No  prevertebral soft tissue swelling is seen. The spinal canal is grossly unremarkable, no large epidural collection or significant canal narrowing. Disc levels: Multilevel cervical spine spondylosis is seen with disc osteophyte complex and uncovertebral osteophytes most notable at C5-C6 and C6-C7 with severe neural foraminal narrowing and mild central canal stenosis. Upper chest: The lung apices are clear. Carotid artery calcifications are seen. Thoracic inlet is within normal limits. Other: None IMPRESSION: No acute intracranial abnormality. Findings consistent with age related atrophy and chronic small vessel ischemia Area of encephalomalacia involving the left parietal lobe No acute fracture or malalignment of the spine. Electronically Signed   By: Jonna Clark M.D.   On: 10/25/2020 21:59   CT Cervical Spine Wo Contrast  Result Date: 10/25/2020 CLINICAL DATA:  Head trauma, post fall EXAM: CT HEAD WITHOUT CONTRAST TECHNIQUE: Contiguous axial images were obtained from the base of the skull through the vertex without intravenous contrast. COMPARISON:  None. FINDINGS: Brain: No evidence of acute territorial infarction, hemorrhage, hydrocephalus,extra-axial collection or mass lesion/mass effect. There is dilatation the ventricles and sulci consistent with age-related atrophy. Low-attenuation changes in the deep white matter consistent with small vessel ischemia. Area of encephalomalacia involving the right superior parietal lobe. Vascular: No hyperdense vessel or unexpected calcification. Skull: The skull is intact. No fracture or focal lesion identified. Sinuses/Orbits: The visualized paranasal sinuses and mastoid air cells are clear. The orbits and globes intact. Other: None Cervical spine: Alignment: There is a mild rightward curvature of the cervical spine with slight reversal of the normal cervical lordosis. A minimal anterolisthesis of C4 on C5 is seen measuring 2 mm. Skull base and vertebrae: Visualized skull  base is intact. No atlanto-occipital dissociation. The vertebral body heights are well maintained. No fracture or pathologic osseous lesion seen. Soft tissues and spinal canal: The visualized paraspinal soft tissues are unremarkable. No prevertebral soft tissue swelling is seen. The spinal canal is grossly unremarkable, no large epidural collection or significant canal narrowing. Disc levels: Multilevel cervical spine spondylosis is seen with disc osteophyte complex and uncovertebral osteophytes most notable at C5-C6 and C6-C7 with severe neural foraminal narrowing and mild central canal stenosis. Upper chest: The lung apices are clear. Carotid artery calcifications are seen. Thoracic inlet is within normal limits. Other: None IMPRESSION: No acute intracranial abnormality. Findings consistent with age related atrophy and chronic small vessel ischemia Area of encephalomalacia involving the left parietal lobe No acute fracture or malalignment of the spine. Electronically Signed   By: Jonna Clark M.D.   On: 10/25/2020 21:59   DG Chest Port 1 View  Result Date: 10/25/2020 CLINICAL DATA:  Fall, weakness EXAM: PORTABLE CHEST 1 VIEW COMPARISON:  July 07, 2016 FINDINGS: The heart size and mediastinal contours are within normal limits. Aortic knob calcifications are seen. Both lungs are clear. The visualized skeletal structures are unremarkable. IMPRESSION: No  active disease. Electronically Signed   By: Jonna Clark M.D.   On: 10/25/2020 21:48    Procedures Procedures {Remember to document critical care time when appropriate:1}  Medications Ordered in ED Medications  sodium chloride 0.9 % bolus 1,000 mL (has no administration in time range)    ED Course  I have reviewed the triage vital signs and the nursing notes.  Pertinent labs & imaging results that were available during my care of the patient were reviewed by me and considered in my medical decision making (see chart for details).    MDM  Rules/Calculators/A&P                          *** Final Clinical Impression(s) / ED Diagnoses Final diagnoses:  None    Rx / DC Orders ED Discharge Orders    None

## 2020-10-25 NOTE — ED Notes (Signed)
Patient transported to CT 

## 2020-10-26 ENCOUNTER — Encounter (HOSPITAL_COMMUNITY): Payer: Self-pay

## 2020-10-26 DIAGNOSIS — R5381 Other malaise: Secondary | ICD-10-CM

## 2020-10-26 DIAGNOSIS — R531 Weakness: Secondary | ICD-10-CM | POA: Diagnosis not present

## 2020-10-26 DIAGNOSIS — F0150 Vascular dementia without behavioral disturbance: Secondary | ICD-10-CM | POA: Diagnosis not present

## 2020-10-26 LAB — BASIC METABOLIC PANEL
Anion gap: 8 (ref 5–15)
BUN: 18 mg/dL (ref 8–23)
CO2: 24 mmol/L (ref 22–32)
Calcium: 7.7 mg/dL — ABNORMAL LOW (ref 8.9–10.3)
Chloride: 105 mmol/L (ref 98–111)
Creatinine, Ser: 0.82 mg/dL (ref 0.44–1.00)
GFR, Estimated: >60 mL/min (ref >60)
Glucose, Bld: 95 mg/dL (ref 70–99)
Potassium: 3.9 mmol/L (ref 3.5–5.1)
Sodium: 137 mmol/L (ref 135–145)

## 2020-10-26 LAB — RESP PANEL BY RT-PCR (FLU A&B, COVID) ARPGX2
Influenza A by PCR: NEGATIVE
Influenza B by PCR: NEGATIVE
SARS Coronavirus 2 by RT PCR: NEGATIVE

## 2020-10-26 MED ORDER — ACETAMINOPHEN 325 MG PO TABS: 650.0000 mg | ORAL_TABLET | ORAL | Status: DC | PRN

## 2020-10-26 MED ORDER — ASPIRIN EC 81 MG PO TBEC
81.0000 mg | DELAYED_RELEASE_TABLET | Freq: Every day | ORAL | Status: DC
  Administered 2020-10-26: 81 mg via ORAL
  Filled 2020-10-26: qty 1

## 2020-10-26 NOTE — ED Notes (Signed)
Pt repositioned in bed at this time. Pt provided with breakfast tray and tray was set up for pt. Pt able to feed self without assistance.

## 2020-10-26 NOTE — Progress Notes (Addendum)
..   Transition of Care [facility]) - Emergency Department Mini Assessment   Patient Details  Name: Sheila Byrd MRN: [id number] Date of Birth: April 18, 1927  Transition of Care [facility]) CM/SW Contact:    Marvis Bakken C Tarpley-Carter, LCSWA Phone Number: 10/26/2020, 10:11 AM   Clinical Narrative: TOC CM/CSW attempted to contact pts daughter/Jackie Whitson [phone].  Pt is still confused and disoriented.  Zniyah Midkiff Tarpley-Carter, MSW, LCSW-A Pronouns:  She, Her, Hers                  [facility] ED Transitions of CareClinical Social Worker [email or web address] [phone]   ED Mini Assessment: What brought you to the Emergency Department? : Weakness  Barriers to Discharge: No Barriers Identified     Means of departure: Not know  Interventions which prevented an admission or readmission: SNF Placement    Patient Contact and Communications        ,          Patient states their goals for this hospitalization and ongoing recovery are:: Patient to gain assistance with weakness. CMS Medicare.gov Compare Post Acute Care list provided to:: Other (Comment Required) Choice offered to / list presented to : NA  Admission diagnosis:  weakness Patient Active Problem List   Diagnosis Date Noted  . Vascular dementia (HCC) 10/26/2020  . Debility 10/26/2020  . Left carotid stenosis 07/09/2016  . Dehydration   . Dizziness 07/07/2016   PCP:  Pt, No Pcp Per (Inactive) Pharmacy:   [facility] [place], [place] - [address] [place] [address] Phone: [phone] Fax: [phone]

## 2020-10-26 NOTE — Evaluation (Addendum)
Physical Therapy Evaluation Patient Details Name: Sheila Byrd MRN: [id number] DOB: 05-14-1927 Today's Date: 10/26/2020   History of Present Illness  Sheila Byrd is an 87 y.o. female with PMH of A.Fib, stroke with residual R sided weakness, debility and frequent falls.Last week on 2/24 pt was brought in to [facility] ED and held there as a boarder when her daughter was admitted to [facility] hospital.     Pt ultimately got SNF placement but before she could go to SNF daughter, who had left hospital AMA, decided to take patient back home.     APS became involved in patient case.     Daughter apparently not caring for patient at home anymore since that time.  Neighbor noted that patient has been weak, not eating much.  Today found patient on floor and pt brought in to ED.  Clinical Impression  The patient is in bed working on eating breakfast. Assisted with some aspects to eat as  right UE not functional for self feeding. Patient had pulled IV from arm, RN notified. Patient requires max assistance for sitting and standing with  posterior leaning.Patient will benefit from higher level of care. Pt admitted with above diagnosis.   Pt currently with functional limitations due to the deficits listed below (see PT Problem List). Pt will benefit from skilled PT to increase their independence and safety with mobility to allow discharge to the venue listed below.        Follow Up Recommendations SNF;Supervision/Assistance - 24 hour    Equipment Recommendations  None recommended by PT    Recommendations for Other Services       Precautions / Restrictions Precautions Precautions: Fall Precaution Comments: needs depends      Mobility  Bed Mobility   Bed Mobility: Supine to Sit;Sit to Supine     Supine to sit: Mod assist Sit to supine: Mod assist   General bed mobility comments: patient initiated moving legs, required mod assist for trunk, mod assist for legs back onto  gurney.    Transfers Overall transfer  level: Needs assistance Equipment used: Rolling walker (2 wheeled) Transfers: Sit to/from Stand Sit to Stand: Max assist;+2 physical assistance         General transfer comment: max assist to rise from gurney, unable to grip right hand on Rw. posterior leaning, feet slide forward. Stood x 2  Ambulation/Gait             General Gait Details: NT  Stairs            Wheelchair Mobility    Modified Rankin (Stroke Patients Only)       Balance Overall balance assessment: Needs assistance Sitting-balance support: Feet unsupported;Single extremity supported Sitting balance-Leahy Scale: Poor   Postural control: Posterior lean Standing balance support: Bilateral upper extremity supported;During functional activity Standing balance-Leahy Scale: Zero Standing balance comment: requires 2 assist to stand                             Pertinent Vitals/Pain Faces Pain Scale: No hurt    Home Living Family/patient expects to be discharged to:: Skilled nursing facility Living Arrangements: Children    patient reports she resides in a mobile home and daughter lives next door, Unknown steps to enter.                Prior Function           Comments: chart indicates requires assistance with all  ADL's, Unsure if ambulatory, pt. unable  to state     Hand Dominance        Extremity/Trunk Assessment   Upper Extremity Assessment RUE Deficits / Details: RUE weakness at baseline from previous CVA. fingers flexed, shoulder A/P rom markedly limited    Lower Extremity Assessment Lower Extremity Assessment: Generalized weakness LLE Deficits / Details: Multiple bruises noted in BLE.    Cervical / Trunk Assessment Cervical / Trunk Assessment: Kyphotic  Communication   Communication: HOH  Cognition Arousal/Alertness: Awake/alert Behavior During Therapy: Flat affect Overall Cognitive Status: No family/caregiver present to determine baseline cognitive  functioning                                 General Comments: patient oriented to self and repeated" My daughter is coming soon". follows simple directions, feeding self a little of food, using left hand      General Comments      Exercises     Assessment/Plan    PT Assessment Patient needs continued PT services  PT Problem List Decreased strength;Decreased mobility;Decreased safety awareness;Decreased range of motion;Decreased knowledge of precautions;Decreased activity tolerance;Decreased cognition;Decreased balance       PT Treatment Interventions DME instruction;Therapeutic activities;Gait training;Therapeutic exercise;Patient/family education;Functional mobility training;Balance training    PT Goals (Current goals can be found in the Care Plan section)  Acute Rehab PT Goals PT Goal Formulation: Patient unable to participate in goal setting Time For Goal Achievement: 11/09/20 Potential to Achieve Goals: Fair    Frequency Min 2X/week   Barriers to discharge        Co-evaluation               AM-PAC PT "6 Clicks" Mobility  Outcome Measure Help needed turning from your back to your side while in a flat bed without using bedrails?: A Lot Help needed moving from lying on your back to sitting on the side of a flat bed without using bedrails?: A Lot Help needed moving to and from a bed to a chair (including a wheelchair)?: Total Help needed standing up from a chair using your arms (e.g., wheelchair or bedside chair)?: Total Help needed to walk in hospital room?: Total Help needed climbing 3-5 steps with a railing? : Total 6 Click Score: 8    End of Session   Activity Tolerance: Patient limited by fatigue Patient left: in bed;with call bell/phone within reach;with nursing/sitter in room Nurse Communication: Mobility status PT Visit Diagnosis: Unsteadiness on feet (R26.81);Muscle weakness (generalized) (M62.81)    Time: 5329-9242 PT Time  Calculation (min) (ACUTE ONLY): 28 min   Charges:   PT Evaluation $PT Eval Low Complexity: 1 Low PT Treatments $Therapeutic Activity: 8-22 mins        Blanchard Kelch PT Acute Rehabilitation Services Pager [phone] Office [phone]   Rada Hay 10/26/2020, 10:56 AM

## 2020-10-26 NOTE — ED Notes (Addendum)
Pt called out stating " I need to pee" Pt informed that she has an external catheter in place and can void when ready. Pt reports that she feels the urge to void but is unable to. Bladder scan reveals 17cc urine in bladder at this time. Pt repositioned in bed. Reoriented to plan of care. Pt continues to call out for help and asking for her daughter Sheila Byrd.

## 2020-10-26 NOTE — Progress Notes (Signed)
TOC CM/CSW received a call from [facility] [phone].  Okey Regal called to offer any help she could concerning this pt.  Neighbors name is Sheila Byrd [phone].  Sheila Byrd/pts daughter [phone] is pts POA.  Pts daughter/Jackie is not in good health either and not capable of caring for pt.  CSW will continue to follow for dc needs.  Margarethe Virgen Tarpley-Carter, MSW, LCSW-A Pronouns:  She, Her, Hers                  [facility] ED Transitions of CareClinical Social Worker [email or web address] [phone]

## 2020-10-26 NOTE — ED Notes (Signed)
PT at bedside.

## 2020-10-26 NOTE — ED Notes (Signed)
Pt continues to yell out "help me. Help me" Upon going into pts room, pt states "I am ready to leave. Help me get out of here." Pt informed of plan of care and reason for wait. Attempted to reorient pt. Will continue to monitor.

## 2020-10-26 NOTE — Progress Notes (Signed)
TOC CM contacted pt's dtr, Grant Fontana, and offered choice and she is agreeable to SNF at [facility], room 112, #[phone]. Pt has not been vaccinated per daughter. Received call from Myrtis Ser with [employer] APS # [phone] or [phone] requesting an update on SNF facility. Isidoro Donning RN CCM, [facility] ED TOC CM [phone]

## 2020-10-26 NOTE — ED Notes (Signed)
[facility] called for transport to facility. 

## 2020-10-26 NOTE — ED Notes (Signed)
Charge RN made aware that pt may need Recruitment consultant, as pt continues to yell out and stating " im going to get up. I need to get up"

## 2020-10-26 NOTE — Consult Note (Signed)
Medical Consultation   Elizet Kaplan  MIW:[id number]  DOB: 08-Jul-1927  DOA: 10/25/2020  PCP: Pt, No Pcp Per (Inactive) Lauretta Grill    Requesting physician: Jodi Geralds, PA-C  Reason for consultation: Hyperkalemia    History of Present Illness: Timothy Townsel is an 87 y.o. female with PMH of A.Fib, stroke with residual R sided weakness, debility and frequent falls.  At baseline patient is totally dependent for ADLs.  Last week on 2/24 pt was brought in to [facility] ED and held there as a boarder when her daughter was admitted to [facility] hospital.  Pt ultimately got SNF placement but before she could go to SNF daughter, who had left hospital AMA, decided to take patient back home.  APS became involved in patient case.  Daughter apparently not caring for patient at home anymore since that time.  Neighbor noted that patient has been weak, not eating much.  Today found patient on floor and pt brought in to ED.  In ED: K 5.8, normal renal function.  No other acute medical abnormalities noted on work up.  Hospitalist was asked to see for hyperkalemia.  Pt given 1L NS and lokelma.  Repeat K is 3.9  Pt is pleasant, mild confusion / dementia.  Knows she is at a hospital, oriented to self.  Denies pain (anywhere) or any other specific complaints.    Review of Systems:  ROS As per HPI otherwise 10 point review of systems negative.     Past Medical History: Past Medical History:  Diagnosis Date  . A-fib (HCC)   . Cellulitis   . Dehydration   . Stroke [facility])     Past Surgical History: Past Surgical History:  Procedure Laterality Date  . TONSILLECTOMY  1954     Allergies:  No Known Allergies   Social History:  reports that she has never smoked. She has never used smokeless tobacco. She reports that she does not drink alcohol and does not use drugs.   Family History: Family History  Problem Relation Age of Onset  . Stroke Mother   . Heart disease  Father        Physical Exam: Vitals:   10/25/20 2022 10/25/20 2100 10/25/20 2218 10/25/20 2300  BP:  97/71 129/73 (!) 141/77  Pulse:  61 71 61  Resp:  (!) 22 15 15   Temp:      TempSrc:      SpO2:  100% 100% 100%  Weight: 40.8 kg     Height: 5\' 1"  (1.549 m)       Constitutional:  Alert and awake, oriented to person and place, not in any acute distress. Eyes: PERLA, EOMI, irises appear normal, anicteric sclera,  ENMT: external ears and nose appear normal,             Lips appears normal, oropharynx mucosa, tongue, posterior pharynx appear normal  Neck: neck appears normal, no masses, normal ROM, no thyromegaly, no JVD  CVS: S1-S2 clear, no murmur rubs or gallops, no LE edema, normal pedal pulses  Respiratory:  clear to auscultation bilaterally, no wheezing, rales or rhonchi. Respiratory effort normal. No accessory muscle use.  Abdomen: soft nontender, nondistended, normal bowel sounds, no hepatosplenomegaly, no hernias  Musculoskeletal: : no cyanosis, clubbing or edema noted bilaterally Neuro: RUE 4/5, LUE 5/5.  Poor balance, standing requires maximal assistance. Psych: judgement and insight appear normal, stable mood and affect, mental status Skin: no  rashes or lesions or ulcers, no induration or nodules    Data reviewed:  I have personally reviewed following labs and imaging studies Labs:  CBC: Recent Labs  Lab 10/25/20 2020  WBC 5.6  NEUTROABS 4.1  HGB 12.2  HCT 37.0  MCV 103.4*  PLT 194    Basic Metabolic Panel: Recent Labs  Lab 10/25/20 2020 10/25/20 2256  NA 136 137  K 5.8* 3.9  CL 102 105  CO2 26 24  GLUCOSE 104* 95  BUN 19 18  CREATININE 1.00 0.82  CALCIUM 8.3* 7.7*   GFR Estimated Creatinine Clearance: 28.8 mL/min (by C-G formula based on SCr of 0.82 mg/dL). Liver Function Tests: Recent Labs  Lab 10/25/20 2020  AST 25  ALT 12  ALKPHOS 75  BILITOT 1.5*  PROT 6.6  ALBUMIN 3.3*   No results for input(s): LIPASE, AMYLASE in the last 168  hours. No results for input(s): AMMONIA in the last 168 hours. Coagulation profile No results for input(s): INR, PROTIME in the last 168 hours.  Cardiac Enzymes: No results for input(s): CKTOTAL, CKMB, CKMBINDEX, TROPONINI in the last 168 hours. BNP: Invalid input(s): POCBNP CBG: No results for input(s): GLUCAP in the last 168 hours. D-Dimer No results for input(s): DDIMER in the last 72 hours. Hgb A1c No results for input(s): HGBA1C in the last 72 hours. Lipid Profile No results for input(s): CHOL, HDL, LDLCALC, TRIG, CHOLHDL, LDLDIRECT in the last 72 hours. Thyroid function studies No results for input(s): TSH, T4TOTAL, T3FREE, THYROIDAB in the last 72 hours.  Invalid input(s): FREET3 Anemia work up No results for input(s): VITAMINB12, FOLATE, FERRITIN, TIBC, IRON, RETICCTPCT in the last 72 hours. Urinalysis    Component Value Date/Time   COLORURINE YELLOW 10/25/2020 2109   APPEARANCEUR HAZY (A) 10/25/2020 2109   LABSPEC 1.020 10/25/2020 2109   PHURINE 5.0 10/25/2020 2109   GLUCOSEU NEGATIVE 10/25/2020 2109   HGBUR NEGATIVE 10/25/2020 2109   BILIRUBINUR NEGATIVE 10/25/2020 2109   KETONESUR 20 (A) 10/25/2020 2109   PROTEINUR NEGATIVE 10/25/2020 2109   UROBILINOGEN 0.2 11/13/2012 0219   NITRITE NEGATIVE 10/25/2020 2109   LEUKOCYTESUR NEGATIVE 10/25/2020 2109     Sepsis Labs Invalid input(s): PROCALCITONIN,  WBC,  LACTICIDVEN Microbiology Recent Results (from the past 240 hour(s))  SARS CORONAVIRUS 2 (TAT 6-24 HRS) Nasopharyngeal Nasopharyngeal Swab     Status: None   Collection Time: 10/18/20 11:04 PM   Specimen: Nasopharyngeal Swab  Result Value Ref Range Status   SARS Coronavirus 2 NEGATIVE NEGATIVE Final    Comment: (NOTE) SARS-CoV-2 target nucleic acids are NOT DETECTED.  The SARS-CoV-2 RNA is generally detectable in upper and lower respiratory specimens during the acute phase of infection. Negative results do not preclude SARS-CoV-2 infection, do not rule  out co-infections with other pathogens, and should not be used as the sole basis for treatment or other patient management decisions. Negative results must be combined with clinical observations, patient history, and epidemiological information. The expected result is Negative.  Fact Sheet for Patients: HairSlick.no  Fact Sheet for Healthcare Providers: quierodirigir.com  This test is not yet approved or cleared by the [place] FDA and  has been authorized for detection and/or diagnosis of SARS-CoV-2 by FDA under an Emergency Use Authorization (EUA). This EUA will remain  in effect (meaning this test can be used) for the duration of the COVID-19 declaration under Se ction 564(b)(1) of the Act, 21 U.S.C. section 360bbb-3(b)(1), unless the authorization is terminated or revoked sooner.  Performed at  [facility], [address] [place], [place] [address]        Inpatient Medications:   Scheduled Meds: Continuous Infusions:   Radiological Exams on Admission: CT Head Wo Contrast  Result Date: 10/25/2020 CLINICAL DATA:  Head trauma, post fall EXAM: CT HEAD WITHOUT CONTRAST TECHNIQUE: Contiguous axial images were obtained from the base of the skull through the vertex without intravenous contrast. COMPARISON:  None. FINDINGS: Brain: No evidence of acute territorial infarction, hemorrhage, hydrocephalus,extra-axial collection or mass lesion/mass effect. There is dilatation the ventricles and sulci consistent with age-related atrophy. Low-attenuation changes in the deep white matter consistent with small vessel ischemia. Area of encephalomalacia involving the right superior parietal lobe. Vascular: No hyperdense vessel or unexpected calcification. Skull: The skull is intact. No fracture or focal lesion identified. Sinuses/Orbits: The visualized paranasal sinuses and mastoid air cells are clear. The orbits and globes intact.  Other: None Cervical spine: Alignment: There is a mild rightward curvature of the cervical spine with slight reversal of the normal cervical lordosis. A minimal anterolisthesis of C4 on C5 is seen measuring 2 mm. Skull base and vertebrae: Visualized skull base is intact. No atlanto-occipital dissociation. The vertebral body heights are well maintained. No fracture or pathologic osseous lesion seen. Soft tissues and spinal canal: The visualized paraspinal soft tissues are unremarkable. No prevertebral soft tissue swelling is seen. The spinal canal is grossly unremarkable, no large epidural collection or significant canal narrowing. Disc levels: Multilevel cervical spine spondylosis is seen with disc osteophyte complex and uncovertebral osteophytes most notable at C5-C6 and C6-C7 with severe neural foraminal narrowing and mild central canal stenosis. Upper chest: The lung apices are clear. Carotid artery calcifications are seen. Thoracic inlet is within normal limits. Other: None IMPRESSION: No acute intracranial abnormality. Findings consistent with age related atrophy and chronic small vessel ischemia Area of encephalomalacia involving the left parietal lobe No acute fracture or malalignment of the spine. Electronically Signed   By: Jonna Clark M.D.   On: 10/25/2020 21:59   CT Cervical Spine Wo Contrast  Result Date: 10/25/2020 CLINICAL DATA:  Head trauma, post fall EXAM: CT HEAD WITHOUT CONTRAST TECHNIQUE: Contiguous axial images were obtained from the base of the skull through the vertex without intravenous contrast. COMPARISON:  None. FINDINGS: Brain: No evidence of acute territorial infarction, hemorrhage, hydrocephalus,extra-axial collection or mass lesion/mass effect. There is dilatation the ventricles and sulci consistent with age-related atrophy. Low-attenuation changes in the deep white matter consistent with small vessel ischemia. Area of encephalomalacia involving the right superior parietal lobe.  Vascular: No hyperdense vessel or unexpected calcification. Skull: The skull is intact. No fracture or focal lesion identified. Sinuses/Orbits: The visualized paranasal sinuses and mastoid air cells are clear. The orbits and globes intact. Other: None Cervical spine: Alignment: There is a mild rightward curvature of the cervical spine with slight reversal of the normal cervical lordosis. A minimal anterolisthesis of C4 on C5 is seen measuring 2 mm. Skull base and vertebrae: Visualized skull base is intact. No atlanto-occipital dissociation. The vertebral body heights are well maintained. No fracture or pathologic osseous lesion seen. Soft tissues and spinal canal: The visualized paraspinal soft tissues are unremarkable. No prevertebral soft tissue swelling is seen. The spinal canal is grossly unremarkable, no large epidural collection or significant canal narrowing. Disc levels: Multilevel cervical spine spondylosis is seen with disc osteophyte complex and uncovertebral osteophytes most notable at C5-C6 and C6-C7 with severe neural foraminal narrowing and mild central canal stenosis. Upper chest: The  lung apices are clear. Carotid artery calcifications are seen. Thoracic inlet is within normal limits. Other: None IMPRESSION: No acute intracranial abnormality. Findings consistent with age related atrophy and chronic small vessel ischemia Area of encephalomalacia involving the left parietal lobe No acute fracture or malalignment of the spine. Electronically Signed   By: Jonna ClarkBindu  Avutu M.D.   On: 10/25/2020 21:59   DG Chest Port 1 View  Result Date: 10/25/2020 CLINICAL DATA:  Fall, weakness EXAM: PORTABLE CHEST 1 VIEW COMPARISON:  July 07, 2016 FINDINGS: The heart size and mediastinal contours are within normal limits. Aortic knob calcifications are seen. Both lungs are clear. The visualized skeletal structures are unremarkable. IMPRESSION: No active disease. Electronically Signed   By: Jonna ClarkBindu  Avutu M.D.   On:  10/25/2020 21:48    Impression/Recommendations Active Problems:   Vascular dementia (HCC)   Debility  1. Initial hyperkalemia on lab work - 1. Got 1L NS bolus and lokelma 2. Resolved on repeat labs 3. Normal renal function 4. Suspect this was either dehydration (now resolved) or a lab error 5. No EKG changes 6. K 3.9 on repeat labs 7. No admission to hospitalist service required at this time 2. Debility, vascular dementia - 1. Chronic and suspect functional status at baseline: evaluation today is very consistent with the PT evaluation done just last week (see PT note on 2/25 for details). 1. Basically patient is maximal assist to even attempt to stand at baseline and very unsteady on feet. 2. PT recommended wheelchair and SNF placement. 3. See the PT note on 2/25 for further details. 2. APS involved 3. SW consult 4. Pt will need SNF placement.      Time Spent: 60 min  Arwyn Besaw M. D.O. [facility] 10/26/2020, 12:05 AM

## 2020-10-26 NOTE — NC FL2 (Signed)
  Sheila Byrd MEDICAID FL2 LEVEL OF CARE SCREENING TOOL     IDENTIFICATION  Patient Name: Sheila Byrd Birthdate: [birth year] Sex: female Admission Date (Current Location): 10/25/2020  [facility] [place] Number:  Producer, television/film/video and Address:  [facility],  [address], [place] [address]      Provider Number: [phone]  Attending Physician Name and Address:  Default, Provider, MD  Relative Name and Phone Number:  [name] [phone]    Current Level of Care: Hospital Recommended Level of Care: Skilled Nursing Facility Prior Approval Number:    Date Approved/Denied:   PASRR Number: [id number]  Discharge Plan: SNF    Current Diagnoses: Patient Active Problem List   Diagnosis Date Noted  . Vascular dementia (HCC) 10/26/2020  . Debility 10/26/2020  . Left carotid stenosis 07/09/2016  . Dehydration   . Dizziness 07/07/2016    Orientation RESPIRATION BLADDER Height & Weight     Self  Normal Continent Weight: 90 lb (40.8 kg) Height:  5\' 1"  (154.9 cm)  BEHAVIORAL SYMPTOMS/MOOD NEUROLOGICAL BOWEL NUTRITION STATUS      Continent Diet (Regular)  AMBULATORY STATUS COMMUNICATION OF NEEDS Skin   Limited Assist Verbally                         Personal Care Assistance Level of Assistance  Bathing,Feeding,Dressing Bathing Assistance: Limited assistance Feeding assistance: Independent Dressing Assistance: Limited assistance     Functional Limitations Info  Sight,Hearing,Speech Sight Info: Adequate Hearing Info: Adequate Speech Info: Adequate    SPECIAL CARE FACTORS FREQUENCY  PT (By licensed PT),OT (By licensed OT)     PT Frequency: 5x a week OT Frequency: 5x a week            Contractures Contractures Info: Not present    Additional Factors Info  Code Status,Allergies Code Status Info: Full Allergies Info: NKA           Current Medications (10/26/2020):  This is the current hospital active medication list Current  Facility-Administered Medications  Medication Dose Route Frequency Provider Last Rate Last Admin  . acetaminophen (TYLENOL) tablet 650 mg  650 mg Oral Q4H PRN 12/26/2020, PA-C      . aspirin EC tablet 81 mg  81 mg Oral Daily Dartha Lodge N, 02-14-1997   81 mg at 10/26/20 1055   Current Outpatient Medications  Medication Sig Dispense Refill  . aspirin EC 81 MG EC tablet Take 1 tablet (81 mg total) by mouth daily. (Patient not taking: No sig reported)       Discharge Medications: Please see discharge summary for a list of discharge medications.  Relevant Imaging Results:  Relevant Lab Results:   Additional Information SSN#:  12/26/20  Ricquita C Tarpley-Carter, LCSWA

## 2020-10-26 NOTE — ED Notes (Signed)
Assisted pt. With feeding.

## 2020-10-26 NOTE — ED Notes (Signed)
Pt yelled out "someone help me" Pt confused and disoriented. This RN attempted to reorient pt. Pt remains confused. Pts bed in lowest position, call bell within reach, bed alarm on, door to room remains open to better visualize pt.

## 2020-10-26 NOTE — Progress Notes (Signed)
TOC CM/CSW attempted to contact Koleen Nimrod Carmicheal/[facility] APS [phone]. CSW left HIPPA compliant message with my contact information.  CSW will continue to follow for dc needs.  Luca Burston Tarpley-Carter, MSW, LCSW-A Pronouns:  She, Her, Hers                  [facility] ED Transitions of CareClinical Social Worker [email or web address] [phone]

## 2020-10-26 NOTE — Discharge Instructions (Addendum)
Call your primary care doctor or specialist as discussed in the next 2-3 days.   Return immediately back to the ER if:  Your symptoms worsen within the next 12-24 hours. You develop new symptoms such as new fevers, persistent vomiting, new pain, shortness of breath, or new weakness or numbness, or if you have any other concerns.  

## 2020-10-27 DIAGNOSIS — R5381 Other malaise: Secondary | ICD-10-CM | POA: Diagnosis not present

## 2020-10-27 DIAGNOSIS — Z8673 Personal history of transient ischemic attack (TIA), and cerebral infarction without residual deficits: Secondary | ICD-10-CM | POA: Diagnosis not present

## 2020-10-27 DIAGNOSIS — R279 Unspecified lack of coordination: Secondary | ICD-10-CM | POA: Diagnosis not present

## 2020-10-27 DIAGNOSIS — M6281 Muscle weakness (generalized): Secondary | ICD-10-CM | POA: Diagnosis not present

## 2020-10-27 DIAGNOSIS — E43 Unspecified severe protein-calorie malnutrition: Secondary | ICD-10-CM | POA: Diagnosis not present

## 2020-10-27 DIAGNOSIS — R1311 Dysphagia, oral phase: Secondary | ICD-10-CM | POA: Diagnosis not present

## 2020-10-27 DIAGNOSIS — G4700 Insomnia, unspecified: Secondary | ICD-10-CM | POA: Diagnosis not present

## 2020-10-27 DIAGNOSIS — R630 Anorexia: Secondary | ICD-10-CM | POA: Diagnosis not present

## 2020-10-27 DIAGNOSIS — E860 Dehydration: Secondary | ICD-10-CM | POA: Diagnosis not present

## 2020-10-27 DIAGNOSIS — Z7409 Other reduced mobility: Secondary | ICD-10-CM | POA: Diagnosis not present

## 2020-10-27 DIAGNOSIS — F419 Anxiety disorder, unspecified: Secondary | ICD-10-CM | POA: Diagnosis not present

## 2020-10-27 DIAGNOSIS — Z743 Need for continuous supervision: Secondary | ICD-10-CM | POA: Diagnosis not present

## 2020-10-27 DIAGNOSIS — R410 Disorientation, unspecified: Secondary | ICD-10-CM | POA: Diagnosis not present

## 2020-10-27 DIAGNOSIS — F0150 Vascular dementia without behavioral disturbance: Secondary | ICD-10-CM | POA: Diagnosis not present

## 2020-10-27 DIAGNOSIS — Z741 Need for assistance with personal care: Secondary | ICD-10-CM | POA: Diagnosis not present

## 2020-10-27 DIAGNOSIS — R451 Restlessness and agitation: Secondary | ICD-10-CM | POA: Diagnosis not present

## 2020-10-27 NOTE — Progress Notes (Signed)
This RN called [employer], spoke to [place], and she is aware that patient is leaving [facility] ED now and on the way.

## 2020-10-29 DIAGNOSIS — F419 Anxiety disorder, unspecified: Secondary | ICD-10-CM | POA: Diagnosis not present

## 2020-10-29 DIAGNOSIS — G4700 Insomnia, unspecified: Secondary | ICD-10-CM | POA: Diagnosis not present

## 2020-10-29 DIAGNOSIS — R410 Disorientation, unspecified: Secondary | ICD-10-CM | POA: Diagnosis not present

## 2020-10-31 DIAGNOSIS — G4700 Insomnia, unspecified: Secondary | ICD-10-CM | POA: Diagnosis not present

## 2020-10-31 DIAGNOSIS — F419 Anxiety disorder, unspecified: Secondary | ICD-10-CM | POA: Diagnosis not present

## 2020-10-31 DIAGNOSIS — E860 Dehydration: Secondary | ICD-10-CM | POA: Diagnosis not present

## 2020-10-31 DIAGNOSIS — R630 Anorexia: Secondary | ICD-10-CM | POA: Diagnosis not present

## 2020-10-31 DIAGNOSIS — F0150 Vascular dementia without behavioral disturbance: Secondary | ICD-10-CM | POA: Diagnosis not present

## 2020-11-01 ENCOUNTER — Other Ambulatory Visit: Payer: Self-pay | Admitting: *Deleted

## 2020-11-01 NOTE — Patient Outreach (Signed)
Member screened for potential [facility] Care Management needs.  Communication sent to [facility] SW to inquire about transition plans and potential [facility] needs.   Will continue to follow while member resides in [facility].   Laveyah Oriol, MSN, RN,BSN [facility] Post Acute Care Coordinator [phone] ( Business Mobile) [phone]  (Toll free office)  

## 2020-11-09 ENCOUNTER — Other Ambulatory Visit: Payer: Self-pay | Admitting: *Deleted

## 2020-11-09 NOTE — Patient Outreach (Signed)
Member screened for potential [facility] services.  Communication sent again and voicemail left for [facility] SW to inquire about transition plans.  Will continue to follow for identifiable [facility] Care Management needs.    Raiford Noble, MSN, RN,BSN [facility] Post Acute Care Coordinator [phone] [facility]) [phone]  (Toll free office)

## 2020-11-09 NOTE — Patient Outreach (Signed)
[facility] Post- Acute Care Coordinator follow up.   Update received from [facility] SN[facility] SW indicating APS is following and is pursuing guardianship. Transition plan is for long term care.  No identifiable [facility] Care Management needs at this time.   Raiford Noble, MSN, RN,BSN [facility] Post Acute Care Coordinator [phone] [facility]) [phone]  (Toll free office)

## 2020-11-20 NOTE — Telephone Encounter (Signed)
Telephone encounter opened in error.

## 2020-11-25 DIAGNOSIS — Z741 Need for assistance with personal care: Secondary | ICD-10-CM | POA: Diagnosis not present

## 2020-11-25 DIAGNOSIS — F0150 Vascular dementia without behavioral disturbance: Secondary | ICD-10-CM | POA: Diagnosis not present

## 2020-11-25 DIAGNOSIS — Z8673 Personal history of transient ischemic attack (TIA), and cerebral infarction without residual deficits: Secondary | ICD-10-CM | POA: Diagnosis not present

## 2020-11-25 DIAGNOSIS — Z7409 Other reduced mobility: Secondary | ICD-10-CM | POA: Diagnosis not present

## 2020-11-25 DIAGNOSIS — E43 Unspecified severe protein-calorie malnutrition: Secondary | ICD-10-CM | POA: Diagnosis not present

## 2020-11-25 DIAGNOSIS — M6281 Muscle weakness (generalized): Secondary | ICD-10-CM | POA: Diagnosis not present

## 2020-11-29 DIAGNOSIS — F0150 Vascular dementia without behavioral disturbance: Secondary | ICD-10-CM | POA: Diagnosis not present

## 2020-11-29 DIAGNOSIS — R451 Restlessness and agitation: Secondary | ICD-10-CM | POA: Diagnosis not present

## 2021-01-04 DIAGNOSIS — R2241 Localized swelling, mass and lump, right lower limb: Secondary | ICD-10-CM | POA: Diagnosis not present

## 2021-01-04 DIAGNOSIS — R2242 Localized swelling, mass and lump, left lower limb: Secondary | ICD-10-CM | POA: Diagnosis not present

## 2021-01-04 DIAGNOSIS — M25561 Pain in right knee: Secondary | ICD-10-CM | POA: Diagnosis not present

## 2021-01-04 DIAGNOSIS — R2231 Localized swelling, mass and lump, right upper limb: Secondary | ICD-10-CM | POA: Diagnosis not present

## 2021-01-04 DIAGNOSIS — M25562 Pain in left knee: Secondary | ICD-10-CM | POA: Diagnosis not present

## 2021-01-04 DIAGNOSIS — M25521 Pain in right elbow: Secondary | ICD-10-CM | POA: Diagnosis not present

## 2021-01-05 DIAGNOSIS — Z7409 Other reduced mobility: Secondary | ICD-10-CM | POA: Diagnosis not present

## 2021-01-05 DIAGNOSIS — L89023 Pressure ulcer of left elbow, stage 3: Secondary | ICD-10-CM | POA: Diagnosis not present

## 2021-01-05 DIAGNOSIS — L89893 Pressure ulcer of other site, stage 3: Secondary | ICD-10-CM | POA: Diagnosis not present

## 2021-01-05 DIAGNOSIS — M6281 Muscle weakness (generalized): Secondary | ICD-10-CM | POA: Diagnosis not present

## 2021-01-09 DIAGNOSIS — L89893 Pressure ulcer of other site, stage 3: Secondary | ICD-10-CM | POA: Diagnosis not present

## 2021-01-09 DIAGNOSIS — Z7409 Other reduced mobility: Secondary | ICD-10-CM | POA: Diagnosis not present

## 2021-01-09 DIAGNOSIS — M6281 Muscle weakness (generalized): Secondary | ICD-10-CM | POA: Diagnosis not present

## 2021-01-09 DIAGNOSIS — L89023 Pressure ulcer of left elbow, stage 3: Secondary | ICD-10-CM | POA: Diagnosis not present

## 2021-01-16 DIAGNOSIS — L89023 Pressure ulcer of left elbow, stage 3: Secondary | ICD-10-CM | POA: Diagnosis not present

## 2021-01-16 DIAGNOSIS — L89893 Pressure ulcer of other site, stage 3: Secondary | ICD-10-CM | POA: Diagnosis not present

## 2021-01-16 DIAGNOSIS — M6281 Muscle weakness (generalized): Secondary | ICD-10-CM | POA: Diagnosis not present

## 2021-01-16 DIAGNOSIS — Z7409 Other reduced mobility: Secondary | ICD-10-CM | POA: Diagnosis not present

## 2021-01-24 DIAGNOSIS — L602 Onychogryphosis: Secondary | ICD-10-CM | POA: Diagnosis not present

## 2021-01-24 DIAGNOSIS — M24571 Contracture, right ankle: Secondary | ICD-10-CM | POA: Diagnosis not present

## 2021-01-24 DIAGNOSIS — M24572 Contracture, left ankle: Secondary | ICD-10-CM | POA: Diagnosis not present

## 2021-01-24 DIAGNOSIS — I739 Peripheral vascular disease, unspecified: Secondary | ICD-10-CM | POA: Diagnosis not present

## 2021-02-24 DIAGNOSIS — I959 Hypotension, unspecified: Secondary | ICD-10-CM | POA: Diagnosis not present

## 2021-02-24 DIAGNOSIS — R58 Hemorrhage, not elsewhere classified: Secondary | ICD-10-CM | POA: Diagnosis not present

## 2021-02-24 DIAGNOSIS — R404 Transient alteration of awareness: Secondary | ICD-10-CM | POA: Diagnosis not present

## 2021-02-24 DIAGNOSIS — S0990XA Unspecified injury of head, initial encounter: Secondary | ICD-10-CM | POA: Diagnosis not present

## 2021-02-24 DIAGNOSIS — R0902 Hypoxemia: Secondary | ICD-10-CM | POA: Diagnosis not present

## 2021-02-25 ENCOUNTER — Emergency Department (HOSPITAL_COMMUNITY)
Admission: EM | Admit: 2021-02-25 | Discharge: 2021-02-25 | Disposition: A | Discharge status: Home / Self Care | Payer: Medicare Other | Attending: Emergency Medicine | Admitting: Emergency Medicine

## 2021-02-25 ENCOUNTER — Other Ambulatory Visit: Payer: Self-pay

## 2021-02-25 ENCOUNTER — Encounter (HOSPITAL_COMMUNITY): Payer: Self-pay | Admitting: Emergency Medicine

## 2021-02-25 ENCOUNTER — Emergency Department (HOSPITAL_COMMUNITY): Payer: Medicare Other

## 2021-02-25 DIAGNOSIS — Z7401 Bed confinement status: Secondary | ICD-10-CM | POA: Diagnosis not present

## 2021-02-25 DIAGNOSIS — W06XXXA Fall from bed, initial encounter: Secondary | ICD-10-CM | POA: Diagnosis not present

## 2021-02-25 DIAGNOSIS — Z7982 Long term (current) use of aspirin: Secondary | ICD-10-CM | POA: Insufficient documentation

## 2021-02-25 DIAGNOSIS — S0990XA Unspecified injury of head, initial encounter: Secondary | ICD-10-CM | POA: Diagnosis present

## 2021-02-25 DIAGNOSIS — Z043 Encounter for examination and observation following other accident: Secondary | ICD-10-CM | POA: Diagnosis not present

## 2021-02-25 DIAGNOSIS — W19XXXA Unspecified fall, initial encounter: Secondary | ICD-10-CM

## 2021-02-25 DIAGNOSIS — S0181XA Laceration without foreign body of other part of head, initial encounter: Secondary | ICD-10-CM | POA: Insufficient documentation

## 2021-02-25 DIAGNOSIS — F0390 Unspecified dementia without behavioral disturbance: Secondary | ICD-10-CM | POA: Insufficient documentation

## 2021-02-25 DIAGNOSIS — R0902 Hypoxemia: Secondary | ICD-10-CM | POA: Diagnosis not present

## 2021-02-25 MED ORDER — LIDOCAINE-EPINEPHRINE-TETRACAINE (LET) TOPICAL GEL
3.0000 mL | Freq: Once | TOPICAL | Status: AC
  Administered 2021-02-25: 02:00:00 3 mL via TOPICAL
  Filled 2021-02-25: qty 3

## 2021-02-25 NOTE — ED Notes (Signed)
Pt's daughter Annice Pih would like an update when pt's scans are resulted. Her number is in the chart

## 2021-02-25 NOTE — ED Provider Notes (Signed)
Sheila Byrd   CSN: [id number] Arrival date & time: 02/25/21  0009     History Chief Complaint  Patient presents with   Fall   Head Laceration    Sheila Byrd is a 87 y.o. female.  The history is provided by the EMS personnel. The history is limited by the condition of the patient (level 5 caveat, dementia).  Fall This is a new problem. The current episode started less than 1 hour ago. The problem occurs constantly. The problem has not changed since onset.Pertinent negatives include no abdominal pain. The symptoms are aggravated by walking. Nothing relieves the symptoms. She has tried nothing for the symptoms. The treatment provided no relief.  Head Laceration This is a new problem. The current episode started less than 1 hour ago. The problem occurs constantly. The problem has not changed since onset.Pertinent negatives include no abdominal pain. Nothing aggravates the symptoms. Nothing relieves the symptoms. She has tried nothing for the symptoms. The treatment provided no relief.      Past Medical History:  Diagnosis Date   A-fib [facility](HCC)    Cellulitis    Dehydration    Stroke [facility](HCC)     Patient Active Problem List   Diagnosis Date Noted   Vascular dementia (HCC) 10/26/2020   Debility 10/26/2020   Left carotid stenosis 07/09/2016   Dehydration    Dizziness 07/07/2016    Past Surgical History:  Procedure Laterality Date   TONSILLECTOMY  1954     OB History   No obstetric history on file.     Family History  Problem Relation Age of Onset   Stroke Mother    Heart disease Father     Social History   Tobacco Use   Smoking status: Never   Smokeless tobacco: Never  Vaping Use   Vaping Use: Never used  Substance Use Topics   Alcohol use: No   Drug use: No    Home Medications Prior to Admission medications   Medication Sig Start Date End Date Taking? Authorizing Provider  aspirin EC 81 MG EC tablet Take 1  tablet (81 mg total) by mouth daily. Patient not taking: No sig reported 07/09/16   Joseph ArtVann, Jessica U, DO    Allergies    Patient has no known allergies.  Review of Systems   Review of Systems  Unable to perform ROS: Dementia  Constitutional:  Negative for fever.  HENT:  Negative for drooling.   Eyes:  Negative for redness.  Respiratory:  Negative for wheezing.   Cardiovascular:  Negative for leg swelling.  Gastrointestinal:  Negative for abdominal pain.   Physical Exam Updated Vital Signs BP (!) 160/103   Pulse 90   Temp 98.6 F (37 C) (Oral)   Resp 18   Ht 5\' 1"  (1.549 m)   Wt 40.8 kg   SpO2 94%   BMI 17.00 kg/m   Physical Exam Vitals and nursing Byrd reviewed.  Constitutional:      General: She is not in acute distress.    Appearance: Normal appearance.  HENT:     Head: Normocephalic.      Right Ear: Tympanic membrane normal.     Left Ear: Tympanic membrane normal.     Nose: Nose normal.     Mouth/Throat:     Mouth: Mucous membranes are moist.     Pharynx: Oropharynx is clear.  Eyes:     Conjunctiva/sclera: Conjunctivae normal.     Pupils: Pupils are equal,  round, and reactive to light.  Cardiovascular:     Rate and Rhythm: Normal rate and regular rhythm.     Pulses: Normal pulses.     Heart sounds: Normal heart sounds.  Pulmonary:     Effort: Pulmonary effort is normal.     Breath sounds: Normal breath sounds.  Abdominal:     General: Abdomen is flat. Bowel sounds are normal.     Palpations: Abdomen is soft.     Tenderness: There is no abdominal tenderness. There is no guarding.  Musculoskeletal:        General: No tenderness. Normal range of motion.     Right wrist: Normal. No snuff box tenderness.     Left wrist: Normal. No snuff box tenderness.     Right hand: Normal.     Left hand: Normal.     Cervical back: Normal, normal range of motion and neck supple.     Thoracic back: Normal.     Lumbar back: Normal.     Right hip: Normal.     Left hip:  Normal.     Right lower leg: No edema.     Left lower leg: No edema.  Skin:    General: Skin is warm and dry.     Capillary Refill: Capillary refill takes less than 2 seconds.  Neurological:     General: No focal deficit present.     Mental Status: She is alert.     Deep Tendon Reflexes: Reflexes normal.  Psychiatric:        Mood and Affect: Mood normal.    ED Results / Procedures / Treatments   Labs (all labs ordered are listed, but only abnormal results are displayed) Labs Reviewed - No data to display  EKG None  Radiology CT Head Wo Contrast  Result Date: 02/25/2021 CLINICAL DATA:  Fall EXAM: CT HEAD WITHOUT CONTRAST CT CERVICAL SPINE WITHOUT CONTRAST TECHNIQUE: Multidetector CT imaging of the head and cervical spine was performed following the standard protocol without intravenous contrast. Multiplanar CT image reconstructions of the cervical spine were also generated. COMPARISON:  None. FINDINGS: CT HEAD FINDINGS Brain: There is no mass, hemorrhage or extra-axial collection. There is generalized atrophy without lobar predilection. There is hypoattenuation of the periventricular white matter, most commonly indicating chronic ischemic microangiopathy. Vascular: No abnormal hyperdensity of the major intracranial arteries or dural venous sinuses. No intracranial atherosclerosis. Skull: The visualized skull base, calvarium and extracranial soft tissues are normal. Sinuses/Orbits: No fluid levels or advanced mucosal thickening of the visualized paranasal sinuses. No mastoid or middle ear effusion. The orbits are normal. CT CERVICAL SPINE FINDINGS Alignment: Grade 1 anterolisthesis at C4-5. Skull base and vertebrae: No acute fracture. Soft tissues and spinal canal: No prevertebral fluid or swelling. No visible canal hematoma. Disc levels: Degenerative disc disease is most pronounced at the C5-7 levels. No spinal canal stenosis. Upper chest: No pneumothorax, pulmonary nodule or pleural effusion.  Other: Normal visualized paraspinal cervical soft tissues. IMPRESSION: 1. Chronic ischemic microangiopathy and generalized atrophy without acute intracranial abnormality. 2. No acute fracture or static subluxation of the cervical spine. Electronically Signed   By: Deatra Robinson M.D.   On: 02/25/2021 01:50   CT Cervical Spine Wo Contrast  Result Date: 02/25/2021 CLINICAL DATA:  Fall EXAM: CT HEAD WITHOUT CONTRAST CT CERVICAL SPINE WITHOUT CONTRAST TECHNIQUE: Multidetector CT imaging of the head and cervical spine was performed following the standard protocol without intravenous contrast. Multiplanar CT image reconstructions of the cervical spine were  also generated. COMPARISON:  None. FINDINGS: CT HEAD FINDINGS Brain: There is no mass, hemorrhage or extra-axial collection. There is generalized atrophy without lobar predilection. There is hypoattenuation of the periventricular white matter, most commonly indicating chronic ischemic microangiopathy. Vascular: No abnormal hyperdensity of the major intracranial arteries or dural venous sinuses. No intracranial atherosclerosis. Skull: The visualized skull base, calvarium and extracranial soft tissues are normal. Sinuses/Orbits: No fluid levels or advanced mucosal thickening of the visualized paranasal sinuses. No mastoid or middle ear effusion. The orbits are normal. CT CERVICAL SPINE FINDINGS Alignment: Grade 1 anterolisthesis at C4-5. Skull base and vertebrae: No acute fracture. Soft tissues and spinal canal: No prevertebral fluid or swelling. No visible canal hematoma. Disc levels: Degenerative disc disease is most pronounced at the C5-7 levels. No spinal canal stenosis. Upper chest: No pneumothorax, pulmonary nodule or pleural effusion. Other: Normal visualized paraspinal cervical soft tissues. IMPRESSION: 1. Chronic ischemic microangiopathy and generalized atrophy without acute intracranial abnormality. 2. No acute fracture or static subluxation of the cervical  spine. Electronically Signed   By: Deatra Robinson M.D.   On: 02/25/2021 01:50    Procedures .Marland KitchenLaceration Repair  Date/Time: 02/25/2021 1:58 AM Performed by: Cy Blamer, MD Authorized by: Cy Blamer, MD   Consent:    Consent obtained:  Verbal   Consent given by:  Patient   Risks discussed:  Infection, need for additional repair and nerve damage   Alternatives discussed:  No treatment Universal protocol:    Patient identity confirmed:  Arm band Anesthesia:    Anesthesia method:  Topical application   Topical anesthetic:  LET Laceration details:    Location:  Face   Face location:  Forehead   Length (cm):  1.5   Depth (mm):  1 Pre-procedure details:    Preparation:  Patient was prepped and draped in usual sterile fashion Exploration:    Hemostasis achieved with:  Direct pressure   Wound extent: no areolar tissue violation noted   Treatment:    Area cleansed with:  Chlorhexidine   Amount of cleaning:  Extensive   Irrigation solution:  Sterile saline   Debridement:  None   Undermining:  None   Scar revision: yes   Skin repair:    Repair method:  Tissue adhesive Approximation:    Approximation:  Close Repair type:    Repair type:  Simple Post-procedure details:    Dressing:  Sterile dressing   Procedure completion:  Tolerated well, no immediate complications   Medications Ordered in ED Medications  lidocaine-EPINEPHrine-tetracaine (LET) topical gel (3 mLs Topical Given 02/25/21 0151)    ED Course  I have reviewed the triage vital signs and the nursing notes.  Pertinent labs & imaging results that were available during my care of the patient were reviewed by me and considered in my medical decision making (see chart for details).  CTs negative, no pain in any joint.  Stable for discharge with close follow up.    Sheila Byrd was evaluated in Emergency Department on 02/25/2021 for the symptoms described in the history of present illness. She was evaluated in the  context of the global COVID-19 pandemic, which necessitated consideration that the patient might be at risk for infection with the SARS-CoV-2 virus that causes COVID-19. Institutional protocols and algorithms that pertain to the evaluation of patients at risk for COVID-19 are in a state of rapid change based on information released by regulatory bodies including the CDC and federal and state organizations. These policies and algorithms were followed  during the patient's care in the ED.  Final Clinical Impression(s) / ED Diagnoses Final diagnoses:  None    Return for intractable cough, coughing up blood, fevers > 100.4 unrelieved by medication, shortness of breath, intractable vomiting, chest pain, shortness of breath, weakness, numbness, changes in speech, facial asymmetry, abdominal pain, passing out, Inability to tolerate liquids or food, cough, altered mental status or any concerns. No signs of systemic illness or infection. The patient is nontoxic-appearing on exam and vital signs are within normal limits. I have reviewed the triage vital signs and the nursing notes. Pertinent labs & imaging results that were available during my care of the patient were reviewed by me and considered in my medical decision making (see chart for details). After history, exam, and medical workup I feel the patient has been appropriately medically screened and is safe for discharge home. Pertinent diagnoses were discussed with the patient. Patient was given return precautions.  Rx / DC Orders ED Discharge Orders     None        Uzma Hellmer, MD 02/25/21 0200

## 2021-02-25 NOTE — ED Triage Notes (Signed)
Pt arrived via EMS from [facility]. Pt rolled out of her bed and hit her head on her wheelchair. Pt is not on blood thinners. EMS denies LOC. Pt has hx of dementia and is alert and oriented to her norm. Pt has a large bruise to her left knee. Pt has a skin tear to her left wrist.

## 2021-02-28 DIAGNOSIS — E43 Unspecified severe protein-calorie malnutrition: Secondary | ICD-10-CM | POA: Diagnosis not present

## 2021-02-28 DIAGNOSIS — F0150 Vascular dementia without behavioral disturbance: Secondary | ICD-10-CM | POA: Diagnosis not present

## 2021-02-28 DIAGNOSIS — R1311 Dysphagia, oral phase: Secondary | ICD-10-CM | POA: Diagnosis not present

## 2021-02-28 DIAGNOSIS — R5381 Other malaise: Secondary | ICD-10-CM | POA: Diagnosis not present

## 2021-02-28 DIAGNOSIS — W19XXXA Unspecified fall, initial encounter: Secondary | ICD-10-CM | POA: Diagnosis not present

## 2021-02-28 DIAGNOSIS — Z8673 Personal history of transient ischemic attack (TIA), and cerebral infarction without residual deficits: Secondary | ICD-10-CM | POA: Diagnosis not present

## 2021-02-28 DIAGNOSIS — M6281 Muscle weakness (generalized): Secondary | ICD-10-CM | POA: Diagnosis not present

## 2021-03-01 DIAGNOSIS — E559 Vitamin D deficiency, unspecified: Secondary | ICD-10-CM | POA: Diagnosis not present

## 2021-03-01 DIAGNOSIS — E43 Unspecified severe protein-calorie malnutrition: Secondary | ICD-10-CM | POA: Diagnosis not present

## 2021-03-01 DIAGNOSIS — Z139 Encounter for screening, unspecified: Secondary | ICD-10-CM | POA: Diagnosis not present

## 2021-03-01 DIAGNOSIS — R5381 Other malaise: Secondary | ICD-10-CM | POA: Diagnosis not present

## 2021-03-05 DIAGNOSIS — F0150 Vascular dementia without behavioral disturbance: Secondary | ICD-10-CM | POA: Diagnosis not present

## 2021-03-05 DIAGNOSIS — E43 Unspecified severe protein-calorie malnutrition: Secondary | ICD-10-CM | POA: Diagnosis not present

## 2021-03-05 DIAGNOSIS — R634 Abnormal weight loss: Secondary | ICD-10-CM | POA: Diagnosis not present

## 2021-03-15 DIAGNOSIS — F0391 Unspecified dementia with behavioral disturbance: Secondary | ICD-10-CM | POA: Diagnosis not present

## 2021-03-15 DIAGNOSIS — G4701 Insomnia due to medical condition: Secondary | ICD-10-CM | POA: Diagnosis not present

## 2021-04-08 DIAGNOSIS — F0391 Unspecified dementia with behavioral disturbance: Secondary | ICD-10-CM | POA: Diagnosis not present

## 2021-04-08 DIAGNOSIS — S51812A Laceration without foreign body of left forearm, initial encounter: Secondary | ICD-10-CM | POA: Diagnosis not present

## 2021-05-03 ENCOUNTER — Other Ambulatory Visit: Payer: Self-pay

## 2021-05-03 ENCOUNTER — Emergency Department (HOSPITAL_COMMUNITY): Payer: Medicare Other

## 2021-05-03 ENCOUNTER — Encounter (HOSPITAL_COMMUNITY): Payer: Self-pay | Admitting: Radiology

## 2021-05-03 ENCOUNTER — Emergency Department (HOSPITAL_COMMUNITY)
Admission: EM | Admit: 2021-05-03 | Discharge: 2021-05-03 | Disposition: A | Discharge status: Home / Self Care | Payer: Medicare Other | Attending: Emergency Medicine | Admitting: Emergency Medicine

## 2021-05-03 DIAGNOSIS — Z7982 Long term (current) use of aspirin: Secondary | ICD-10-CM | POA: Diagnosis not present

## 2021-05-03 DIAGNOSIS — S72002A Fracture of unspecified part of neck of left femur, initial encounter for closed fracture: Secondary | ICD-10-CM | POA: Diagnosis not present

## 2021-05-03 DIAGNOSIS — S7292XA Unspecified fracture of left femur, initial encounter for closed fracture: Secondary | ICD-10-CM

## 2021-05-03 DIAGNOSIS — F0150 Vascular dementia without behavioral disturbance: Secondary | ICD-10-CM | POA: Insufficient documentation

## 2021-05-03 DIAGNOSIS — S79912A Unspecified injury of left hip, initial encounter: Secondary | ICD-10-CM | POA: Diagnosis present

## 2021-05-03 DIAGNOSIS — W19XXXA Unspecified fall, initial encounter: Secondary | ICD-10-CM | POA: Diagnosis not present

## 2021-05-03 NOTE — ED Triage Notes (Signed)
Pt arrives via [facility] from [facility] for follow up of a fall on Wednesday. EMS reports pt had x-rays performed then that showed acute fx of L hip. Pt A+Ox2 (person, place) on arrival with hx of dementia.   EMS last VS - SBP 138, HR 60, RR 16, 97% on RA, CBG 102

## 2021-05-03 NOTE — Progress Notes (Signed)
CSW spoke with pt's Legal Guardian Duayne Cal, informed her pt will be d/c back to [facility] and a referral for Palliative care was made with Victorino Dike from [facility]. CSW attempted to contact pt's daughter Evelen Vazguez left HIPPA compliant VM.     Valentina Shaggy.Eunice Oldaker, MSW, LCSWA [facility]  Transitions of Care Clinical Social Worker I Direct Dial: [phone]  Fax: [phone] [email or web address]

## 2021-05-03 NOTE — ED Notes (Signed)
Called report to [facility].

## 2021-05-03 NOTE — Progress Notes (Signed)
I spoke with the patient's guardian Sheila Byrd over the phone today regarding recent findings of a left hip fracture.  Based on our discussion it sounds like the patient is nonambulatory at baseline and has advanced dementia and lives permanently at a nursing home.  We talked about the risks and benefits and alternatives of surgical treatment versus palliative care.  Based on our discussion Sheila Byrd would like to try palliative care first as a way to make the patient comfortable and if it is unsuccessful then we can revisit the surgical option.  I spoke to Dr. Wallace Cullens who is the attending in the ED and she will reach out to the hospitalist and palliative care to find out if the patient needs to be admitted in order to be consulted by palliative care or if this can be accomplished back at the nursing home since the patient seems to be fairly comfortable.

## 2021-05-03 NOTE — ED Provider Notes (Signed)
[facility]EM[facility]GENCY [facility]PT Provider Note   CSN: [id number] Arrival date & time: 05/03/21  0751     History Chief Complaint  Patient presents with   Sheila Byrd    Sheila Byrd is a 87 y.o. female.  Patient is a 87 yo female presenting via [facility] from [facility] for follow up of a fall. Patient has hx of dementia and is a poor historian. I spoke with nursing home representative who states patient was found down beside her bed on Tuesday 04/30/2021. Unknown head trauma. ASA only, no blood thinners. Patient had no complaints until evaluated by physical therapy 9/7 and had xray demonstrating displaced fracture of femoral neck with angulation of fracture site yesterday. Patient denies any pain at this time.   The history is provided by the patient and the EMS personnel. No language interpreter was used.  Fall Pertinent negatives include no chest pain, no abdominal pain and no shortness of breath.      Past Medical History:  Diagnosis Date   A-fib [facility])    Cellulitis    Dehydration    Stroke [facility])     Patient Active Problem List   Diagnosis Date Noted   Vascular dementia (HCC) 10/26/2020   Debility 10/26/2020   Left carotid stenosis 07/09/2016   Dehydration    Dizziness 07/07/2016    Past Surgical History:  Procedure Laterality Date   TONSILLECTOMY  1954     OB History   No obstetric history on file.     Family History  Problem Relation Age of Onset   Stroke Mother    Heart disease Father     Social History   Tobacco Use   Smoking status: Never   Smokeless tobacco: Never  Vaping Use   Vaping Use: Never used  Substance Use Topics   Alcohol use: No   Drug use: No    Home Medications Prior to Admission medications   Medication Sig Start Date End Date Taking? Authorizing Provider  aspirin EC 81 MG EC tablet Take 1 tablet (81 mg total) by mouth daily. Patient not taking: No sig reported 07/09/16   Joseph Art, DO     Allergies    Patient has no known allergies.  Review of Systems   Review of Systems  Constitutional:  Negative for chills and fever.  HENT:  Negative for ear pain and sore throat.   Eyes:  Negative for pain and visual disturbance.  Respiratory:  Negative for cough and shortness of breath.   Cardiovascular:  Negative for chest pain and palpitations.  Gastrointestinal:  Negative for abdominal pain and vomiting.  Genitourinary:  Negative for dysuria and hematuria.  Musculoskeletal:  Negative for arthralgias and back pain.  Skin:  Negative for color change and rash.  Neurological:  Negative for seizures and syncope.  All other systems reviewed and are negative.  Physical Exam Updated Vital Signs BP (!) 152/81 (BP Location: Left Arm)   Pulse 81   Temp 98 F (36.7 C) (Oral)   Resp 20   Ht 5\' 1"  (1.549 m)   Wt 43.1 kg   SpO2 99%   BMI 17.95 kg/m   Physical Exam Vitals and nursing note reviewed.  Constitutional:      General: She is not in acute distress.    Appearance: She is well-developed.  HENT:     Head: Normocephalic and atraumatic.  Eyes:     Conjunctiva/sclera: Conjunctivae normal.  Cardiovascular:     Rate and  Rhythm: Normal rate and regular rhythm.     Heart sounds: No murmur heard. Pulmonary:     Effort: Pulmonary effort is normal. No respiratory distress.     Breath sounds: Normal breath sounds.  Abdominal:     Palpations: Abdomen is soft.     Tenderness: There is no abdominal tenderness.  Musculoskeletal:     Cervical back: Neck supple. No bony tenderness.     Thoracic back: No bony tenderness.     Lumbar back: No bony tenderness.     Right hip: No bony tenderness.     Left hip: Bony tenderness present.     Right upper leg: Normal. No bony tenderness.     Left upper leg: Normal. No bony tenderness.     Right knee: Normal.     Left knee: Normal.     Right lower leg: Normal.     Left lower leg: Normal.     Comments: DP intact bilaterally, normal  perfusion to skin, normal motor function and sensation.   Skin:    General: Skin is warm and dry.  Neurological:     Mental Status: She is alert.    ED Results / Procedures / Treatments   Labs (all labs ordered are listed, but only abnormal results are displayed) Labs Reviewed - No data to display  EKG None  Radiology No results found.  Procedures Procedures   Medications Ordered in ED Medications - No data to display  ED Course  I have reviewed the triage vital signs and the nursing notes.  Pertinent labs & imaging results that were available during my care of the patient were reviewed by me and considered in my medical decision making (see chart for details).    MDM Rules/Calculators/A&P                          8:44 AM 87 yo female presenting via [facility] from [facility] for follow up of a fall on Wednesday with concerns for left hip fracture on xray from outside facility. On exam pt is alert, hx of dementia-baseline as per discussion with [employer]. Pt denies any pain at this time. Physical exam of left hip demonstrate displaced subcapital fracture LEFT femoral neck.  -Stable CT head and neck.  Patient recommended for inpatient stay and evaluation by orthopedic surgery for 3 day old left femoral neck fracture. I spoke with patient's court appointed guardian, Nicole Cella, who was given contact information for orthopedic surgeon. After discussion they have decided no surgery at this time and she is requesting palliative care consult. Consult to transition of care team and palliative care placed.          Final Clinical Impression(s) / ED Diagnoses Final diagnoses:  Left displaced femoral neck fracture [facility])    Rx / DC Orders ED Discharge Orders     None        Franne Forts, DO 05/04/21 0908

## 2021-05-03 NOTE — ED Notes (Signed)
Pt urinate in bed so the RN and I changed all bedding and place her on a purewick along with a brief.

## 2021-05-03 NOTE — ED Notes (Signed)
[facility] called for transport back to [facility]. Advised she was #29 on their list.

## 2021-05-03 NOTE — Progress Notes (Signed)
[facility])  Referral received for outpatient palliative care at [facility].  [facility] will follow up with patient and her family in the facility.  Wallis Bamberg RN, BSN, CCRN [facility] Liaison

## 2021-05-03 NOTE — Discharge Instructions (Addendum)
Patient's court appointed guardian discussed treatment options for femur fracture with orthopedic surgery and has decided that surgery is not in the best interest of the patient due to age, risk of surgery, and the fact that that patient is already wheel chair bound and will likely not affect her as much. Patient recommended for tylenol 650mg  every 6 hours for pain with close follow up with primary care if pain is still severe.   Guardian also requesting palliative care. Consult placed.

## 2022-04-25 DEATH — deceased
